# Patient Record
Sex: Female | Born: 1985 | Race: Black or African American | Marital: Single | Smoking: Current every day smoker
Health system: Southern US, Community
[De-identification: ages and names within clinical notes are randomized; demographics above are authoritative.]

---
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
## 2010-09-25 ENCOUNTER — Encounter: Payer: Self-pay | Admitting: Gastroenterology

## 2011-07-27 ENCOUNTER — Emergency Department (HOSPITAL_COMMUNITY)
Admission: EM | Admit: 2011-07-27 | Discharge: 2011-07-28 | Disposition: A | Discharge status: Home / Self Care | Payer: Self-pay | Attending: Emergency Medicine | Admitting: Emergency Medicine

## 2011-07-27 ENCOUNTER — Other Ambulatory Visit: Payer: Self-pay

## 2011-07-27 DIAGNOSIS — R079 Chest pain, unspecified: Secondary | ICD-10-CM | POA: Insufficient documentation

## 2011-07-27 DIAGNOSIS — F17200 Nicotine dependence, unspecified, uncomplicated: Secondary | ICD-10-CM | POA: Insufficient documentation

## 2011-07-27 DIAGNOSIS — J40 Bronchitis, not specified as acute or chronic: Secondary | ICD-10-CM

## 2011-07-27 MED ORDER — HYDROMORPHONE HCL PF 1 MG/ML IJ SOLN
1.0000 mg | Freq: Once | INTRAMUSCULAR | Status: AC
  Administered 2011-07-28: 1 mg via INTRAVENOUS
  Filled 2011-07-27: qty 1

## 2011-07-27 MED ORDER — ONDANSETRON HCL 4 MG/2ML IJ SOLN
4.0000 mg | Freq: Once | INTRAMUSCULAR | Status: AC
  Administered 2011-07-28: 4 mg via INTRAVENOUS
  Filled 2011-07-27: qty 2

## 2011-07-28 ENCOUNTER — Encounter (HOSPITAL_COMMUNITY): Payer: Self-pay | Admitting: *Deleted

## 2011-07-28 ENCOUNTER — Emergency Department (HOSPITAL_COMMUNITY): Payer: Self-pay

## 2011-07-28 LAB — CBC
MCH: 30.9 pg (ref 26.0–34.0)
Platelets: 267 10*3/uL (ref 150–400)
RBC: 5.15 MIL/uL — ABNORMAL HIGH (ref 3.87–5.11)
WBC: 21.8 10*3/uL — ABNORMAL HIGH (ref 4.0–10.5)

## 2011-07-28 LAB — POCT I-STAT TROPONIN I

## 2011-07-28 LAB — D-DIMER, QUANTITATIVE: D-Dimer, Quant: 0.71 ug/mL-FEU — ABNORMAL HIGH (ref 0.00–0.48)

## 2011-07-28 LAB — DIFFERENTIAL
Eosinophils Absolute: 1.2 10*3/uL — ABNORMAL HIGH (ref 0.0–0.7)
Lymphocytes Relative: 26 % (ref 12–46)
Lymphs Abs: 5.8 10*3/uL — ABNORMAL HIGH (ref 0.7–4.0)
Neutro Abs: 13.1 10*3/uL — ABNORMAL HIGH (ref 1.7–7.7)
Neutrophils Relative %: 60 % (ref 43–77)

## 2011-07-28 LAB — BASIC METABOLIC PANEL
GFR calc non Af Amer: 75 mL/min — ABNORMAL LOW (ref >90)
Glucose, Bld: 98 mg/dL (ref 70–99)
Potassium: 3.6 mEq/L (ref 3.5–5.1)
Sodium: 140 mEq/L (ref 135–145)

## 2011-07-28 MED ORDER — HYDROCODONE-ACETAMINOPHEN 5-325 MG PO TABS: 1.0000 | ORAL_TABLET | Freq: Four times a day (QID) | ORAL | Status: AC | PRN

## 2011-07-28 MED ORDER — DOXYCYCLINE HYCLATE 100 MG PO CAPS: 100.0000 mg | ORAL_CAPSULE | Freq: Two times a day (BID) | ORAL | Status: AC

## 2011-07-28 MED ORDER — IOHEXOL 300 MG/ML  SOLN
100.0000 mL | Freq: Once | INTRAMUSCULAR | Status: AC | PRN
  Administered 2011-07-28: 100 mL via INTRAVENOUS

## 2011-07-28 NOTE — ED Provider Notes (Signed)
History     CSN: [id number] Arrival date & time: 07/27/2011 11:47 PM   First MD Initiated Contact with Patient 07/27/11 2349      Chief Complaint  Patient presents with  . Chest Pain    (Consider location/radiation/quality/duration/timing/severity/associated sxs/prior treatment) Patient is a 25 y.o. female presenting with chest pain. The history is provided by the patient (the pt complains of chest pain with inspiration).  Chest Pain The chest pain began 2 days ago. Chest pain occurs frequently. The chest pain is unchanged. The pain is associated with breathing. At its most intense, the pain is at 5/10. The pain is currently at 3/10. The severity of the pain is moderate. The pain does not radiate. Primary symptoms include a fever. Pertinent negatives for primary symptoms include no fatigue, no cough and no abdominal pain. She tried nothing for the symptoms. Risk factors include no known risk factors.  Pertinent negatives for past medical history include no aneurysm and no seizures.     History reviewed. No pertinent past medical history.  No past surgical history on file.  No family history on file.  History  Substance Use Topics  . Smoking status: Not on file  . Smokeless tobacco: Not on file  . Alcohol Use: Not on file    OB History    Grav Para Term Preterm Abortions TAB SAB Ect Mult Living                  Review of Systems  Constitutional: Positive for fever. Negative for fatigue.  HENT: Negative for congestion, sinus pressure and ear discharge.   Eyes: Negative for discharge.  Respiratory: Negative for cough.   Cardiovascular: Positive for chest pain.  Gastrointestinal: Negative for abdominal pain and diarrhea.  Genitourinary: Negative for frequency and hematuria.  Musculoskeletal: Negative for back pain.  Skin: Negative for rash.  Neurological: Negative for seizures and headaches.  Hematological: Negative.   Psychiatric/Behavioral: Negative for  hallucinations.    Allergies  Review of patient's allergies indicates no known allergies.  Home Medications   Current Outpatient Rx  Name Route Sig Dispense Refill  . DOXYCYCLINE HYCLATE 100 MG PO CAPS Oral Take 1 capsule (100 mg total) by mouth 2 (two) times daily. 20 capsule 0  . HYDROCODONE-ACETAMINOPHEN 5-325 MG PO TABS Oral Take 1 tablet by mouth every 6 (six) hours as needed for pain. 20 tablet 0    BP 155/98  Pulse 75  Temp(Src) 99.6 F (37.6 C) (Oral)  Resp 17  Ht 5\' 3"  (1.6 m)  Wt 155 lb (70.308 kg)  BMI 27.46 kg/m2  SpO2 100%  LMP 07/23/2011  Physical Exam  Constitutional: She is oriented to person, place, and time. She appears well-developed.  HENT:  Head: Normocephalic and atraumatic.  Eyes: Conjunctivae and EOM are normal. No scleral icterus.  Neck: Neck supple. No thyromegaly present.  Cardiovascular: Normal rate and regular rhythm.  Exam reveals no gallop and no friction rub.   No murmur heard. Pulmonary/Chest: No stridor. She has no wheezes. She has no rales. She exhibits no tenderness.  Abdominal: She exhibits no distension. There is no tenderness. There is no rebound.  Musculoskeletal: Normal range of motion. She exhibits no edema.  Lymphadenopathy:    She has no cervical adenopathy.  Neurological: She is oriented to person, place, and time. Coordination normal.  Skin: No rash noted. No erythema.  Psychiatric: She has a normal mood and affect. Her behavior is normal.    ED Course  Procedures (  including critical care time)  Labs Reviewed  CBC - Abnormal; Notable for the following:    WBC 21.8 (*)    RBC 5.15 (*)    Hemoglobin 15.9 (*)    MCHC 36.5 (*)    All other components within normal limits  DIFFERENTIAL - Abnormal; Notable for the following:    Neutro Abs 13.1 (*)    Lymphs Abs 5.8 (*)    Monocytes Absolute 1.5 (*)    Eosinophils Relative 6 (*)    Eosinophils Absolute 1.2 (*)    All other components within normal limits  BASIC  METABOLIC PANEL - Abnormal; Notable for the following:    GFR calc non Af Amer 75 (*)    GFR calc Af Amer 87 (*)    All other components within normal limits  D-DIMER, QUANTITATIVE - Abnormal; Notable for the following:    D-Dimer, Quant 0.71 (*)    All other components within normal limits  POCT I-STAT TROPONIN I  I-STAT TROPONIN I   Dg Chest 2 View  07/28/2011  *RADIOLOGY REPORT*  Clinical Data: Chest pain.  CHEST - 2 VIEW 07/28/2011:  Comparison: None.  Findings: Cardiomediastinal silhouette unremarkable.  Lungs clear. Bronchovascular markings normal.  Pulmonary vascularity normal.  No pleural effusions.  No pneumothorax.  Visualized bony thorax intact.  IMPRESSION: Normal examination.  Original Report Authenticated By: Arnell Sieving, M.D.   Ct Angio Chest W/cm &/or Wo Cm  07/28/2011  *RADIOLOGY REPORT*  Clinical Data:  Smoker presenting with chest pain, shortness of breath, and elevated D-dimer.  CT ANGIOGRAPHY CHEST WITH CONTRAST 07/08/2011:,  Technique:  Multidetector CT imaging of the chest was performed using the standard protocol during bolus administration of intravenous contrast.  Multiplanar CT image reconstructions including MIPs were obtained to evaluate the vascular anatomy.  Contrast: OMNIPAQUE IOHEXOL 300 MG/ML IV.  Comparison:  No prior CT.  Two-view chest x-ray earlier same date.  Findings:  Contrast opacification of pulmonary arteries is very good.  No filling defects within either main pulmonary artery or their branches in either lung to suggest pulmonary embolism.  Heart size normal.  No visible coronary artery calcification.  No pericardial effusion.  No visible atherosclerosis involving the thoracic or upper abdominal aorta or their visualized branches.  Central airways patent with marked central peribronchial thickening.  Scattered areas of hyperlucency in both lungs.  No localized airspace consolidation.  No evidence of interstitial lung disease.  No pulmonary  parenchymal nodules or masses.  No pleural effusions.  No significant mediastinal, hilar, or axillary lymphadenopathy. Visualized thyroid gland unremarkable.  Visualized upper abdomen unremarkable for the early arterial phase of enhancement.  Bone window images unremarkable.  Review of the MIP images confirms the above findings.  IMPRESSION:  1.  No evidence of pulmonary embolism. 2.  Marked central bronchial wall thickening and scattered areas of localized air trapping in both lungs is consistent with severe bronchitis and/or asthma.  No acute cardiopulmonary disease otherwise.  Original Report Authenticated By: Arnell Sieving, M.D.     1. Bronchitis   2. Chest pain       MDM   Date: 07/28/2011  Rate: 85  Rhythm: normal sinus rhythm  QRS Axis: normal  Intervals: PR prolonged  ST/T Wave abnormalities: nonspecific ST changes  Conduction Disutrbances:none  Narrative Interpretation:   Old EKG Reviewed: none available          Benny Lennert, MD 07/28/11 [phone]

## 2014-09-19 ENCOUNTER — Ambulatory Visit: Payer: Self-pay | Admitting: Family Medicine

## 2015-08-31 ENCOUNTER — Emergency Department: Payer: Self-pay

## 2015-08-31 ENCOUNTER — Emergency Department
Admission: EM | Admit: 2015-08-31 | Discharge: 2015-08-31 | Disposition: A | Discharge status: Home / Self Care | Payer: Self-pay | Attending: Emergency Medicine | Admitting: Emergency Medicine

## 2015-08-31 ENCOUNTER — Encounter: Payer: Self-pay | Admitting: Emergency Medicine

## 2015-08-31 DIAGNOSIS — Y998 Other external cause status: Secondary | ICD-10-CM | POA: Insufficient documentation

## 2015-08-31 DIAGNOSIS — S63619A Unspecified sprain of unspecified finger, initial encounter: Secondary | ICD-10-CM

## 2015-08-31 DIAGNOSIS — Y9389 Activity, other specified: Secondary | ICD-10-CM | POA: Insufficient documentation

## 2015-08-31 DIAGNOSIS — Y9289 Other specified places as the place of occurrence of the external cause: Secondary | ICD-10-CM | POA: Insufficient documentation

## 2015-08-31 DIAGNOSIS — S63613A Unspecified sprain of left middle finger, initial encounter: Secondary | ICD-10-CM | POA: Insufficient documentation

## 2015-08-31 DIAGNOSIS — F17210 Nicotine dependence, cigarettes, uncomplicated: Secondary | ICD-10-CM | POA: Insufficient documentation

## 2015-08-31 DIAGNOSIS — S63501A Unspecified sprain of right wrist, initial encounter: Secondary | ICD-10-CM | POA: Insufficient documentation

## 2015-08-31 MED ORDER — OXYCODONE-ACETAMINOPHEN 5-325 MG PO TABS
1.0000 | ORAL_TABLET | Freq: Once | ORAL | Status: AC
  Administered 2015-08-31: 1 via ORAL
  Filled 2015-08-31: qty 1

## 2015-08-31 MED ORDER — IBUPROFEN 800 MG PO TABS
800.0000 mg | ORAL_TABLET | Freq: Once | ORAL | Status: AC
  Administered 2015-08-31: 800 mg via ORAL
  Filled 2015-08-31: qty 1

## 2015-08-31 MED ORDER — NAPROXEN 500 MG PO TABS: 500.0000 mg | ORAL_TABLET | Freq: Two times a day (BID) | ORAL | Status: DC
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
## 2015-08-31 NOTE — Discharge Instructions (Signed)
Wear splint for 2-3 days as needed. Finger Sprain A finger sprain happens when the bands of tissue that hold the finger bones together (ligaments) stretch too much and tear. HOME CARE  Keep your injured finger raised (elevated) when possible.  Put ice on the injured area, twice a day, for 2 to 3 days.  Put ice in a plastic bag.  Place a towel between your skin and the bag.  Leave the ice on for 15 minutes.  Only take medicine as told by your doctor.  Do not wear rings on the injured finger.  Protect your finger until pain and stiffness go away (usually 3 to 4 weeks).  Do not get your cast or splint to get wet. Cover your cast or splint with a plastic bag when you shower or bathe. Do not swim.  Your doctor may suggest special exercises for you to do. These exercises will help keep or stop stiffness from happening. GET HELP RIGHT AWAY IF:  Your cast or splint gets damaged.  Your pain gets worse, not better. MAKE SURE YOU:  Understand these instructions.  Will watch your condition.  Will get help right away if you are not doing well or get worse.   This information is not intended to replace advice given to you by your health care provider. Make sure you discuss any questions you have with your health care provider.   Document Released: 09/24/2010 Document Revised: 11/14/2011 Document Reviewed: 04/25/2011 Elsevier Interactive Patient Education [employer]2016 [employer]

## 2016-04-05 ENCOUNTER — Emergency Department
Admission: EM | Admit: 2016-04-05 | Discharge: 2016-04-05 | Disposition: A | Discharge status: Home / Self Care | Payer: Self-pay | Attending: Emergency Medicine | Admitting: Emergency Medicine

## 2016-04-05 ENCOUNTER — Encounter: Payer: Self-pay | Admitting: Emergency Medicine

## 2016-04-05 ENCOUNTER — Emergency Department: Payer: Self-pay

## 2016-04-05 DIAGNOSIS — F17210 Nicotine dependence, cigarettes, uncomplicated: Secondary | ICD-10-CM | POA: Insufficient documentation

## 2016-04-05 DIAGNOSIS — Y999 Unspecified external cause status: Secondary | ICD-10-CM | POA: Insufficient documentation

## 2016-04-05 DIAGNOSIS — S61411A Laceration without foreign body of right hand, initial encounter: Secondary | ICD-10-CM | POA: Insufficient documentation

## 2016-04-05 DIAGNOSIS — S60221A Contusion of right hand, initial encounter: Secondary | ICD-10-CM

## 2016-04-05 DIAGNOSIS — Y929 Unspecified place or not applicable: Secondary | ICD-10-CM | POA: Insufficient documentation

## 2016-04-05 DIAGNOSIS — W25XXXA Contact with sharp glass, initial encounter: Secondary | ICD-10-CM | POA: Insufficient documentation

## 2016-04-05 DIAGNOSIS — Y9389 Activity, other specified: Secondary | ICD-10-CM | POA: Insufficient documentation

## 2016-04-05 NOTE — ED Triage Notes (Addendum)
Patient brought in by [employer] for medical clearance. Patient with laceration to right wrist, bleeding controlled. Patient refuses to answer how she got the laceration to her wrist.
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
## 2016-04-05 NOTE — ED Notes (Signed)
Pt discharged to jail with police, pt cursing and yelling not able to review instructions at this time.
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
## 2016-04-05 NOTE — ED Notes (Signed)
Pt cursing and refusing to listen to instructions at this time, paper sent with police to jail

## 2016-04-05 NOTE — ED Notes (Signed)
Abrasions cleaned with saline and peroxide

## 2016-04-05 NOTE — ED Notes (Signed)
X ray done

## 2017-01-14 ENCOUNTER — Emergency Department
Admission: EM | Admit: 2017-01-14 | Discharge: 2017-01-14 | Disposition: A | Discharge status: Home / Self Care | Payer: BLUE CROSS/BLUE SHIELD | Attending: Emergency Medicine | Admitting: Emergency Medicine

## 2017-01-14 ENCOUNTER — Emergency Department: Payer: BLUE CROSS/BLUE SHIELD

## 2017-01-14 ENCOUNTER — Encounter: Payer: Self-pay | Admitting: Emergency Medicine

## 2017-01-14 DIAGNOSIS — R319 Hematuria, unspecified: Secondary | ICD-10-CM | POA: Diagnosis present

## 2017-01-14 DIAGNOSIS — N390 Urinary tract infection, site not specified: Secondary | ICD-10-CM | POA: Insufficient documentation

## 2017-01-14 DIAGNOSIS — F17210 Nicotine dependence, cigarettes, uncomplicated: Secondary | ICD-10-CM | POA: Diagnosis not present

## 2017-01-14 LAB — CBC
HCT: 42.4 % (ref 35.0–47.0)
Hemoglobin: 15 g/dL (ref 12.0–16.0)
MCH: 29.7 pg (ref 26.0–34.0)
MCHC: 35.4 g/dL (ref 32.0–36.0)
MCV: 84 fL (ref 80.0–100.0)
PLATELETS: 240 10*3/uL (ref 150–440)
RBC: 5.04 MIL/uL (ref 3.80–5.20)
RDW: 13.2 % (ref 11.5–14.5)
WBC: 8.8 10*3/uL (ref 3.6–11.0)

## 2017-01-14 LAB — COMPREHENSIVE METABOLIC PANEL
ALBUMIN: 3.8 g/dL (ref 3.5–5.0)
ALT: 18 U/L (ref 14–54)
AST: 24 U/L (ref 15–41)
Alkaline Phosphatase: 55 U/L (ref 38–126)
Anion gap: 7 (ref 5–15)
BUN: 11 mg/dL (ref 6–20)
CALCIUM: 8.8 mg/dL — AB (ref 8.9–10.3)
CHLORIDE: 108 mmol/L (ref 101–111)
CO2: 25 mmol/L (ref 22–32)
CREATININE: 1.01 mg/dL — AB (ref 0.44–1.00)
GFR calc Af Amer: >60 mL/min (ref >60)
GFR calc non Af Amer: >60 mL/min (ref >60)
GLUCOSE: 146 mg/dL — AB (ref 65–99)
Potassium: 3.7 mmol/L (ref 3.5–5.1)
SODIUM: 140 mmol/L (ref 135–145)
Total Bilirubin: 0.8 mg/dL (ref 0.3–1.2)
Total Protein: 7.1 g/dL (ref 6.5–8.1)

## 2017-01-14 LAB — URINALYSIS, COMPLETE (UACMP) WITH MICROSCOPIC
BACTERIA UA: NONE SEEN
Bilirubin Urine: NEGATIVE
Glucose, UA: NEGATIVE mg/dL
KETONES UR: NEGATIVE mg/dL
Nitrite: NEGATIVE
PROTEIN: 30 mg/dL — AB
Specific Gravity, Urine: 1.024 (ref 1.005–1.030)
pH: 6 (ref 5.0–8.0)

## 2017-01-14 LAB — WET PREP, GENITAL
CLUE CELLS WET PREP: NONE SEEN
SPERM: NONE SEEN
Trich, Wet Prep: NONE SEEN
Yeast Wet Prep HPF POC: NONE SEEN

## 2017-01-14 LAB — POCT PREGNANCY, URINE
PREG TEST UR: NEGATIVE
Preg Test, Ur: NEGATIVE

## 2017-01-14 LAB — LIPASE, BLOOD: LIPASE: 60 U/L — AB (ref 11–51)

## 2017-01-14 LAB — CHLAMYDIA/NGC RT PCR (ARMC ONLY)
CHLAMYDIA TR: NOT DETECTED
N gonorrhoeae: NOT DETECTED

## 2017-01-14 MED ORDER — CEPHALEXIN 500 MG PO CAPS: 500.0000 mg | ORAL_CAPSULE | Freq: Two times a day (BID) | ORAL | 0 refills | Status: DC

## 2017-01-14 NOTE — ED Notes (Signed)
Patient transported to CT 

## 2017-01-14 NOTE — ED Notes (Signed)
Pt returned from CT. Pt remains in NAD at this time.

## 2017-01-14 NOTE — ED Triage Notes (Signed)
Patient presents to the ED with dysuria, hematuria, urinary frequency and right lower pelvic pain.  Patient is in no obvious distress at this time.

## 2017-01-14 NOTE — ED Provider Notes (Signed)
[facility] Emergency Department Provider Note  ____________________________________________  Time seen: Approximately 8:08 PM  I have reviewed the triage vital signs and the nursing notes.   HISTORY  Chief Complaint Abdominal Pain and Hematuria    HPI Isabella Clements is a 31 y.o. female who complains of right groin pain and feeling like she has swollen lymph nodes. She's been the [place] monogamous with one sexual partner for the past 6 years. Recently they had a discussion where he revealed that he has been sleeping with other people as well. She is concerned about sexual transmitted infections . Denies vaginal bleeding or discharge. Does complain of dysuria and urinary frequency. No abdominal pain.     History reviewed. No pertinent past medical history.   There are no active problems to display for this patient.    History reviewed. No pertinent surgical history.   Prior to Admission medications   Medication Sig Start Date End Date Taking? Authorizing Provider  cephALEXin (KEFLEX) 500 MG capsule Take 1 capsule (500 mg total) by mouth 2 (two) times daily. 01/14/17   Sharman CheekStafford, Rebekha Diveley, MD  naproxen (NAPROSYN) 500 MG tablet Take 1 tablet (500 mg total) by mouth 2 (two) times daily with a meal. 08/31/15   Joni ReiningSmith, Ronald K, PA-C     Allergies Patient has no known allergies.   No family history on file.  Social History Social History  Substance Use Topics  . Smoking status: Current Every Day Smoker    Packs/day: 0.50    Types: Cigarettes  . Smokeless tobacco: Never Used  . Alcohol use 4.2 oz/week    7 Cans of beer per week     Comment: pt states, "a beer a day"    Review of Systems  Constitutional:   No fever or chills.  ENT:   No sore throat. No rhinorrhea. Lymphatic: No swollen glands, No extremity swelling Endocrine: No hot/cold flashes. No significant weight change. No neck swelling. Cardiovascular:   No chest pain or  syncope. Respiratory:   No dyspnea or cough. Gastrointestinal:   Negative for abdominal pain, vomiting and diarrhea.  Genitourinary:   Positive dysuria and frequency. Positive hematuria.. Musculoskeletal:   Negative for focal pain or swelling Neurological:   Negative for headaches or weakness. All other systems reviewed and are negative except as documented above in ROS and HPI.  ____________________________________________   PHYSICAL EXAM:  VITAL SIGNS: ED Triage Vitals  Enc Vitals Group     BP 01/14/17 1533 (!) 137/116     Pulse Rate 01/14/17 1533 100     Resp 01/14/17 1533 18     Temp 01/14/17 1533 98.5 F (36.9 C)     Temp Source 01/14/17 1533 Oral     SpO2 01/14/17 1533 100 %     Weight --      Height 01/14/17 1534 5\' 3"  (1.6 m)     Head Circumference --      Peak Flow --      Pain Score 01/14/17 1533 6     Pain Loc --      Pain Edu? --      Excl. in GC? --     Vital signs reviewed, nursing assessments reviewed.   Constitutional:   Alert and oriented. Well appearing and in no distress. Eyes:   No scleral icterus. No conjunctival pallor. PERRL. EOMI.  No nystagmus. ENT   Head:   Normocephalic and atraumatic.   Nose:   No congestion/rhinnorhea. No septal hematoma  Mouth/Throat:   MMM, no pharyngeal erythema. No peritonsillar mass.    Neck:   No stridor. No SubQ emphysema. No meningismus. Hematological/Lymphatic/Immunilogical:   No cervical lymphadenopathy. Cardiovascular:   RRR. Symmetric bilateral radial and DP pulses.  No murmurs.  Respiratory:   Normal respiratory effort without tachypnea nor retractions. Breath sounds are clear and equal bilaterally. No wheezes/rales/rhonchi. Gastrointestinal:   Soft With slight right lower quadrant tenderness, no tenderness at McBurney's point.. Non distended. There is no CVA tenderness.  No rebound, rigidity, or guarding. Genitourinary:   Performed with nurse at bedside.  external exam unremarkable. Subcentimeter  lymph nodes in the right inguinal area. Speculum exam unremarkable. No pathologic discharge. Bimanual exam shows no CMT, no adnexal tenderness or mass. Overall unremarkable exam Musculoskeletal:   Normal range of motion in all extremities. No joint effusions.  No lower extremity tenderness.  No edema. Neurologic:   Normal speech and language.  CN 2-10 normal. Motor grossly intact. No gross focal neurologic deficits are appreciated.  Skin:    Skin is warm, dry and intact. No rash noted.  No petechiae, purpura, or bullae.  ____________________________________________    LABS (pertinent positives/negatives) (all labs ordered are listed, but only abnormal results are displayed) Labs Reviewed  WET PREP, GENITAL - Abnormal; Notable for the following:       Result Value   WBC, Wet Prep HPF POC FEW (*)    All other components within normal limits  LIPASE, BLOOD - Abnormal; Notable for the following:    Lipase 60 (*)    All other components within normal limits  COMPREHENSIVE METABOLIC PANEL - Abnormal; Notable for the following:    Glucose, Bld 146 (*)    Creatinine, Ser 1.01 (*)    Calcium 8.8 (*)    All other components within normal limits  URINALYSIS, COMPLETE (UACMP) WITH MICROSCOPIC - Abnormal; Notable for the following:    Color, Urine YELLOW (*)    APPearance HAZY (*)    Hgb urine dipstick LARGE (*)    Protein, ur 30 (*)    Leukocytes, UA TRACE (*)    Squamous Epithelial / LPF 0-5 (*)    All other components within normal limits  CHLAMYDIA/NGC RT PCR ([facility] ONLY)  URINE CULTURE  CBC  RPR  HIV ANTIBODY (ROUTINE TESTING)  POC URINE PREG, ED  POCT PREGNANCY, URINE  POCT PREGNANCY, URINE   ____________________________________________   EKG    ____________________________________________    RADIOLOGY  Ct Renal Stone Study  Result Date: 01/14/2017 CLINICAL DATA:  Acute onset of right lower pelvic pain and increased urinary frequency. Hematuria and dysuria.  Initial encounter. EXAM: CT ABDOMEN AND PELVIS WITHOUT CONTRAST TECHNIQUE: Multidetector CT imaging of the abdomen and pelvis was performed following the standard protocol without IV contrast. COMPARISON:  None. FINDINGS: Lower chest: The visualized lung bases are grossly clear. The visualized portions of the mediastinum are unremarkable. Hepatobiliary: The liver is unremarkable in appearance. The gallbladder is unremarkable in appearance. The common bile duct remains normal in caliber. Pancreas: The pancreas is within normal limits. Spleen: The spleen is unremarkable in appearance. Adrenals/Urinary Tract: The adrenal glands are unremarkable in appearance. Scattered nonobstructing bilateral renal stones are seen, measuring up to 3 mm in size. Mild nonspecific perinephric stranding is noted bilaterally. There is no evidence of hydronephrosis. No obstructing ureteral stones are identified. Stomach/Bowel: The stomach is unremarkable in appearance. The small bowel is within normal limits. The appendix is normal in caliber, without evidence of appendicitis. The colon  is unremarkable in appearance. Vascular/Lymphatic: The abdominal aorta is unremarkable in appearance. The inferior vena cava is grossly unremarkable. No retroperitoneal lymphadenopathy is seen. No pelvic sidewall lymphadenopathy is identified. Reproductive: The bladder is mildly distended and grossly unremarkable. The uterus is unremarkable in appearance. The ovaries are relatively symmetric. Minimal soft tissue inflammation about the cervix may reflect mild pelvic infection. Other: No additional soft tissue abnormalities are seen. Musculoskeletal: No acute osseous abnormalities are identified. The visualized musculature is unremarkable in appearance. IMPRESSION: 1. Minimal soft tissue inflammation about the cervix may reflect mild pelvic infection. Would correlate with the patient's symptoms. 2. Nonobstructing bilateral renal stones measure up to 3 mm in  size. No evidence of hydronephrosis. No obstructing ureteral stone seen. Electronically Signed   By: Roanna Raider M.D.   On: 01/14/2017 18:53    ____________________________________________   PROCEDURES Procedures  ____________________________________________   INITIAL IMPRESSION / ASSESSMENT AND PLAN / ED COURSE  Pertinent labs & imaging results that were available during my care of the patient were reviewed by me and considered in my medical decision making (see chart for details).    Clinical Course as of Jan 15 2007  Sat Jan 14, 2017  1745 Pelvic exam without overt signs of inflammation/infection. Will get ct a/p for further eval of sx.   [PS]    Clinical Course User Index [PS] Sharman Cheek, MD     ----------------------------------------- 8:14 PM on 01/14/2017 -----------------------------------------  Workup consistent with cystitis. No evidence of pyelonephritis or sepsis. Abdomen otherwise benign with unremarkable workup. Not consistent with PID TOA torsion. GC chlamydia Trichomonas all negative.Considering the patient's symptoms, medical history, and physical examination today, I have low suspicion for cholecystitis or biliary pathology, pancreatitis, perforation or bowel obstruction, hernia, intra-abdominal abscess, AAA or dissection, volvulus or intussusception, mesenteric ischemia, or appendicitis.  Follow-up primary care, Keflex twice a day for UTI. Urine culture sent.  ____________________________________________   FINAL CLINICAL IMPRESSION(S) / ED DIAGNOSES  Final diagnoses:  Lower urinary tract infectious disease  Hematuria, unspecified type      New Prescriptions   CEPHALEXIN (KEFLEX) 500 MG CAPSULE    Take 1 capsule (500 mg total) by mouth 2 (two) times daily.     Portions of this note were generated with dragon dictation software. Dictation errors may occur despite best attempts at proofreading.    Sharman Cheek, MD 01/14/17  2015

## 2017-01-16 LAB — URINE CULTURE: Culture: 50000 — AB

## 2017-01-16 LAB — HIV ANTIBODY (ROUTINE TESTING W REFLEX): HIV Screen 4th Generation wRfx: NONREACTIVE

## 2017-01-16 LAB — RPR: RPR: NONREACTIVE

## 2017-11-10 ENCOUNTER — Emergency Department: Payer: BLUE CROSS/BLUE SHIELD

## 2017-11-10 ENCOUNTER — Encounter: Payer: Self-pay | Admitting: Emergency Medicine

## 2017-11-10 ENCOUNTER — Emergency Department
Admission: EM | Admit: 2017-11-10 | Discharge: 2017-11-11 | Disposition: A | Discharge status: Home / Self Care | Payer: BLUE CROSS/BLUE SHIELD | Attending: Emergency Medicine | Admitting: Emergency Medicine

## 2017-11-10 ENCOUNTER — Other Ambulatory Visit: Payer: Self-pay

## 2017-11-10 DIAGNOSIS — R202 Paresthesia of skin: Secondary | ICD-10-CM | POA: Diagnosis not present

## 2017-11-10 DIAGNOSIS — R200 Anesthesia of skin: Secondary | ICD-10-CM | POA: Diagnosis present

## 2017-11-10 DIAGNOSIS — F17210 Nicotine dependence, cigarettes, uncomplicated: Secondary | ICD-10-CM | POA: Diagnosis not present

## 2017-11-10 LAB — CBC
HEMATOCRIT: 41.7 % (ref 35.0–47.0)
HEMOGLOBIN: 14.5 g/dL (ref 12.0–16.0)
MCH: 29.6 pg (ref 26.0–34.0)
MCHC: 34.7 g/dL (ref 32.0–36.0)
MCV: 85.3 fL (ref 80.0–100.0)
Platelets: 296 10*3/uL (ref 150–440)
RBC: 4.89 MIL/uL (ref 3.80–5.20)
RDW: 13.1 % (ref 11.5–14.5)
WBC: 13.8 10*3/uL — ABNORMAL HIGH (ref 3.6–11.0)

## 2017-11-10 LAB — BASIC METABOLIC PANEL
ANION GAP: 8 (ref 5–15)
BUN: 18 mg/dL (ref 6–20)
CHLORIDE: 108 mmol/L (ref 101–111)
CO2: 22 mmol/L (ref 22–32)
Calcium: 8.8 mg/dL — ABNORMAL LOW (ref 8.9–10.3)
Creatinine, Ser: 0.93 mg/dL (ref 0.44–1.00)
GFR calc Af Amer: >60 mL/min (ref >60)
GFR calc non Af Amer: >60 mL/min (ref >60)
GLUCOSE: 134 mg/dL — AB (ref 65–99)
POTASSIUM: 3.8 mmol/L (ref 3.5–5.1)
Sodium: 138 mmol/L (ref 135–145)

## 2017-11-10 LAB — TROPONIN I: Troponin I: <0.03 ng/mL (ref <0.03)

## 2017-11-10 LAB — LIPASE, BLOOD: Lipase: 42 U/L (ref 11–51)

## 2017-11-10 NOTE — ED Triage Notes (Signed)
Pt reports about a week ago she woke up and felt numbness in her right foot and toes; also having tingling and numbness in the right side of her abd/flank; yesterday leg started feeling week and knee would buckle when walking; now tingling across her belly and having intermittent chest pain; currently no chest pain; pain is "strong"; talking in complete coherent sentences; pt in no acute distress

## 2017-11-11 LAB — URINALYSIS, COMPLETE (UACMP) WITH MICROSCOPIC
Bacteria, UA: NONE SEEN
Bilirubin Urine: NEGATIVE
Glucose, UA: NEGATIVE mg/dL
Hgb urine dipstick: NEGATIVE
Ketones, ur: 5 mg/dL — AB
Leukocytes, UA: NEGATIVE
Nitrite: NEGATIVE
PROTEIN: NEGATIVE mg/dL
Specific Gravity, Urine: 1.023 (ref 1.005–1.030)
pH: 6 (ref 5.0–8.0)

## 2017-11-11 LAB — CK: CK TOTAL: 222 U/L (ref 38–234)

## 2017-11-11 MED ORDER — DIAZEPAM 2 MG PO TABS
2.0000 mg | ORAL_TABLET | Freq: Once | ORAL | Status: AC
  Administered 2017-11-11: 2 mg via ORAL
  Filled 2017-11-11: qty 1

## 2017-11-11 NOTE — ED Notes (Signed)
Pt states that for the past few days she has felt a numbness and tingling in her legs.  She also reports that it feels like someone is squeezing her right knee.  Pt denies pain to legs.  Pt denies hx of diabetes or any other issues.  Pt is not tender on knee to touch.  Pt denies any other symptoms at this time.  Pt states that she cannot urinate at this time and is requesting water.  EDP okay with this.  Pt is A&Ox4, in NAD.

## 2017-11-11 NOTE — ED Provider Notes (Signed)
[facility] Emergency Department Provider Note   ____________________________________________   First MD Initiated Contact with Patient 11/11/17 0015     (approximate)  I have reviewed the triage vital signs and the nursing notes.   HISTORY  Chief Complaint Numbness; Abdominal Pain; and Chest Pain    HPI Isabella Clements is a 32 y.o. female who presents to the ED from home with a chief complaint of right leg numbness and tingling.  Patient reports that one week ago she woke up and felt numbness in her right foot and toes.  Over the course of the week she is now having numbness and tingling to her entire right leg radiating to her right flank.  Symptoms are worse from mid leg to mid thigh circumferentially.  Patient denies extremity weakness but feels like someone is squeezing her right knee and it buckles under her especially at the end of the workday.  Sometimes feels like she is experiencing muscle cramps.  Also describes her right leg as "heavy".  Patient is a CNA.  Denies back pain, bowel or bladder incontinence.  Denies chest pain as noted in triage notes.  Denies recent fever, chills, vision changes, headache, shortness of breath, nausea, vomiting, diarrhea.  Denies recent travel or trauma.  Denies hormone use.   Past medical history None  There are no active problems to display for this patient.   History reviewed. No pertinent surgical history.  Prior to Admission medications   Not on File    Allergies Patient has no known allergies.  History reviewed. No pertinent family history.  Social History Social History   Tobacco Use  . Smoking status: Current Every Day Smoker    Packs/day: 0.50    Types: Cigarettes  . Smokeless tobacco: Never Used  Substance Use Topics  . Alcohol use: Yes    Alcohol/week: 4.2 oz    Types: 7 Cans of beer per week    Comment: pt states, "a beer a day"  . Drug use: No    Review of  Systems  Constitutional: No fever/chills. Eyes: No visual changes. ENT: No sore throat. Cardiovascular: Denies chest pain. Respiratory: Denies shortness of breath. Gastrointestinal: No abdominal pain.  No nausea, no vomiting.  No diarrhea.  No constipation. Genitourinary: Negative for dysuria. Musculoskeletal: Negative for back pain. Skin: Negative for rash. Neurological: Positive for right leg paresthesias.  Negative for headaches, focal weakness.   ____________________________________________   PHYSICAL EXAM:  VITAL SIGNS: ED Triage Vitals  Enc Vitals Group     BP 11/10/17 2009 (!) 158/103     Pulse Rate 11/10/17 2009 83     Resp 11/10/17 2009 17     Temp 11/10/17 2009 99 F (37.2 C)     Temp Source 11/10/17 2009 Oral     SpO2 11/10/17 2009 100 %     Weight 11/10/17 2013 148 lb (67.1 kg)     Height 11/10/17 2013 5\' 3"  (1.6 m)     Head Circumference --      Peak Flow --      Pain Score 11/10/17 2012 7     Pain Loc --      Pain Edu? --      Excl. in GC? --     Constitutional: Alert and oriented. Well appearing and in no acute distress. Eyes: Conjunctivae are normal. PERRL. EOMI. Head: Atraumatic. Nose: No congestion/rhinnorhea. Mouth/Throat: Mucous membranes are moist.  Oropharynx non-erythematous. Neck: No stridor.  No cervical spine tenderness  to palpation.  Supple neck without meningismus.  No carotid bruits. Cardiovascular: Normal rate, regular rhythm. Grossly normal heart sounds.  Good peripheral circulation. Respiratory: Normal respiratory effort.  No retractions. Lungs CTAB. Gastrointestinal: Soft and nontender. No distention. No abdominal bruits. No CVA tenderness. Musculoskeletal: No spinal tenderness to palpation.  No lower extremity tenderness nor edema.  No joint effusions. Neurologic: Alert and oriented x3.  CN II-XII grossly intact.  Normal speech and language.  5/5 motor strength in all extremities.  Symmetrical sensation in bilateral upper extremities.   Hypersensitive on the right lower extremity.  Negative straight leg raise.  No gait instability. Skin:  Skin is warm, dry and intact. No rash noted.  No vesicles.  No petechiae. Psychiatric: Mood and affect are normal. Speech and behavior are normal.  ____________________________________________   LABS (all labs ordered are listed, but only abnormal results are displayed)  Labs Reviewed  BASIC METABOLIC PANEL - Abnormal; Notable for the following components:      Result Value   Glucose, Bld 134 (*)    Calcium 8.8 (*)    All other components within normal limits  CBC - Abnormal; Notable for the following components:   WBC 13.8 (*)    All other components within normal limits  URINALYSIS, COMPLETE (UACMP) WITH MICROSCOPIC - Abnormal; Notable for the following components:   Color, Urine YELLOW (*)    APPearance HAZY (*)    Ketones, ur 5 (*)    Squamous Epithelial / LPF 6-30 (*)    All other components within normal limits  TROPONIN I  LIPASE, BLOOD  CK  POC URINE PREG, ED   ____________________________________________  EKG  ED ECG REPORT I, SUNG,JADE J, the attending physician, personally viewed and interpreted this ECG.   Date: 11/11/2017  EKG Time: 2017  Rate: 61  Rhythm: normal EKG, normal sinus rhythm  Axis: Normal  Intervals:none  ST&T Change: Nonspecific  ____________________________________________  RADIOLOGY  ED MD interpretation: No acute cardiopulmonary process  Official radiology report(s): Dg Chest 2 View  Result Date: 11/10/2017 CLINICAL DATA:  Chest pain EXAM: CHEST - 2 VIEW COMPARISON:  CT 07/28/2011, radiograph 07/28/2011 FINDINGS: The heart size and mediastinal contours are within normal limits. Both lungs are clear. The visualized skeletal structures are unremarkable. IMPRESSION: No active cardiopulmonary disease. Electronically Signed   By: Jasmine Pang M.D.   On: 11/10/2017 20:46     ____________________________________________   PROCEDURES  Procedure(s) performed: None  Procedures  Critical Care performed: No  ____________________________________________   INITIAL IMPRESSION / ASSESSMENT AND PLAN / ED COURSE  As part of my medical decision making, I reviewed the following data within the electronic MEDICAL RECORD NUMBER Nursing notes reviewed and incorporated, Labs reviewed, Old chart reviewed and Notes from prior ED visits   32 year old female who presents with a one-week history of right leg paresthesia and "heavy feeling surrounding her knee.  Differential diagnosis includes but is not limited to TIA, CVA, MS, [place], infectious, metabolic etiologies, etc.  Laboratory and urinalysis results are unremarkable. Unclear etiology of patient's symptoms. I did recommend proceeding with MRI for further evaluation. Patient declines, states she will follow up with her PCP early next week. I encouraged her to speak with her doctor for neurology referral.  Will administer Valium for muscle spasms.  Strict return precautions given.  Patient verbalizes understanding and agrees with plan of care.      ____________________________________________   FINAL CLINICAL IMPRESSION(S) / ED DIAGNOSES  Final diagnoses:  Paresthesia  ED Discharge Orders    None       Note:  This document was prepared using Dragon voice recognition software and may include unintentional dictation errors.    Irean Hong, MD 11/11/17 [id number]

## 2017-11-11 NOTE — Discharge Instructions (Signed)
Please see your doctor as soon as possible.  He will likely need a referral to see a neurologist for your symptoms.  You were offered an MRI tonight but declined.  Please return to the ED if you change your mind or symptoms worsen, you experience leg weakness, losing control of your bowel or bladder, or other concerns.

## 2019-05-22 IMAGING — CR DG CHEST 2V
2 series · 2 of 2 positions shown · non-contrast
Comparison: CT 07/28/2011, radiograph 07/28/2011

CLINICAL DATA: Chest pain

EXAM:
CHEST - 2 VIEW

[chest pa]
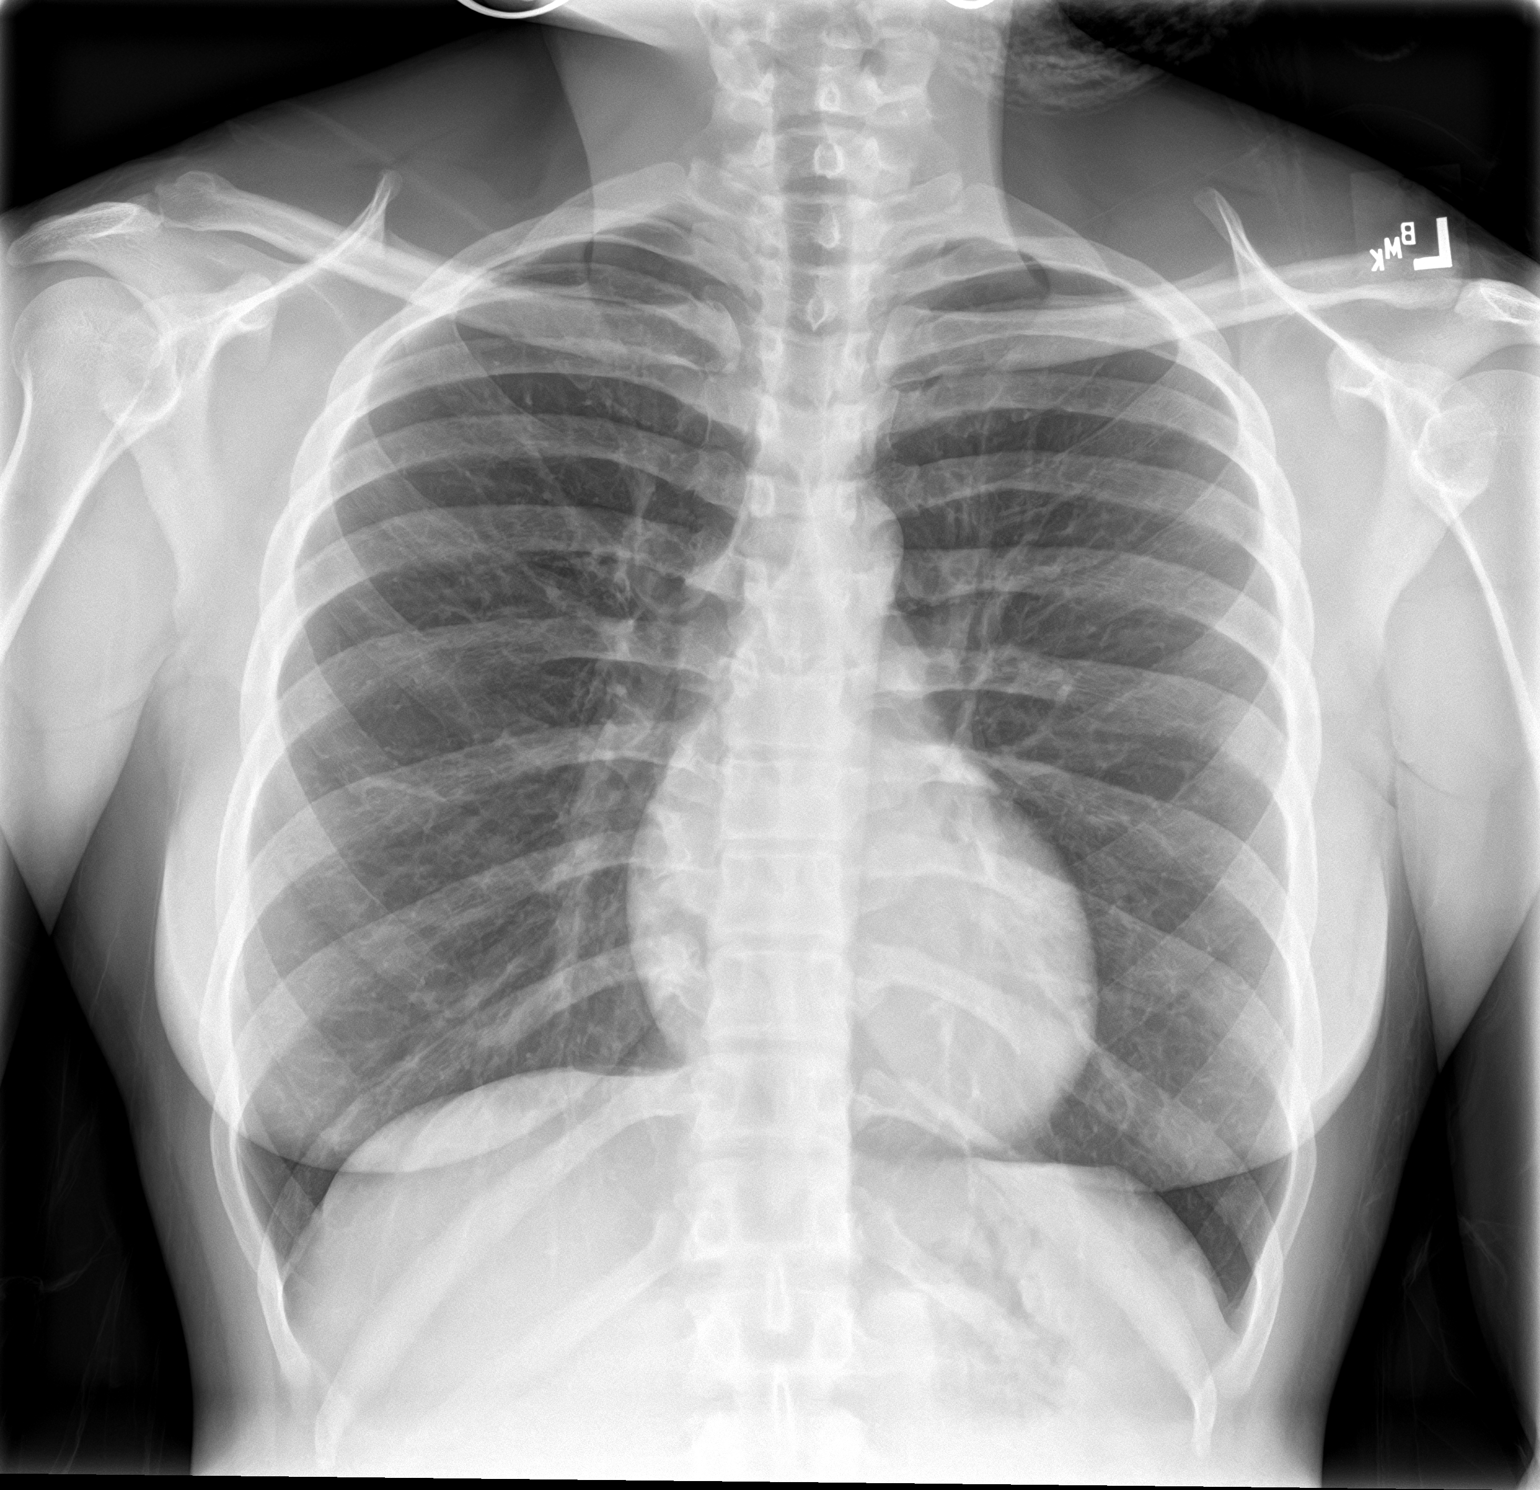

[chest lat]
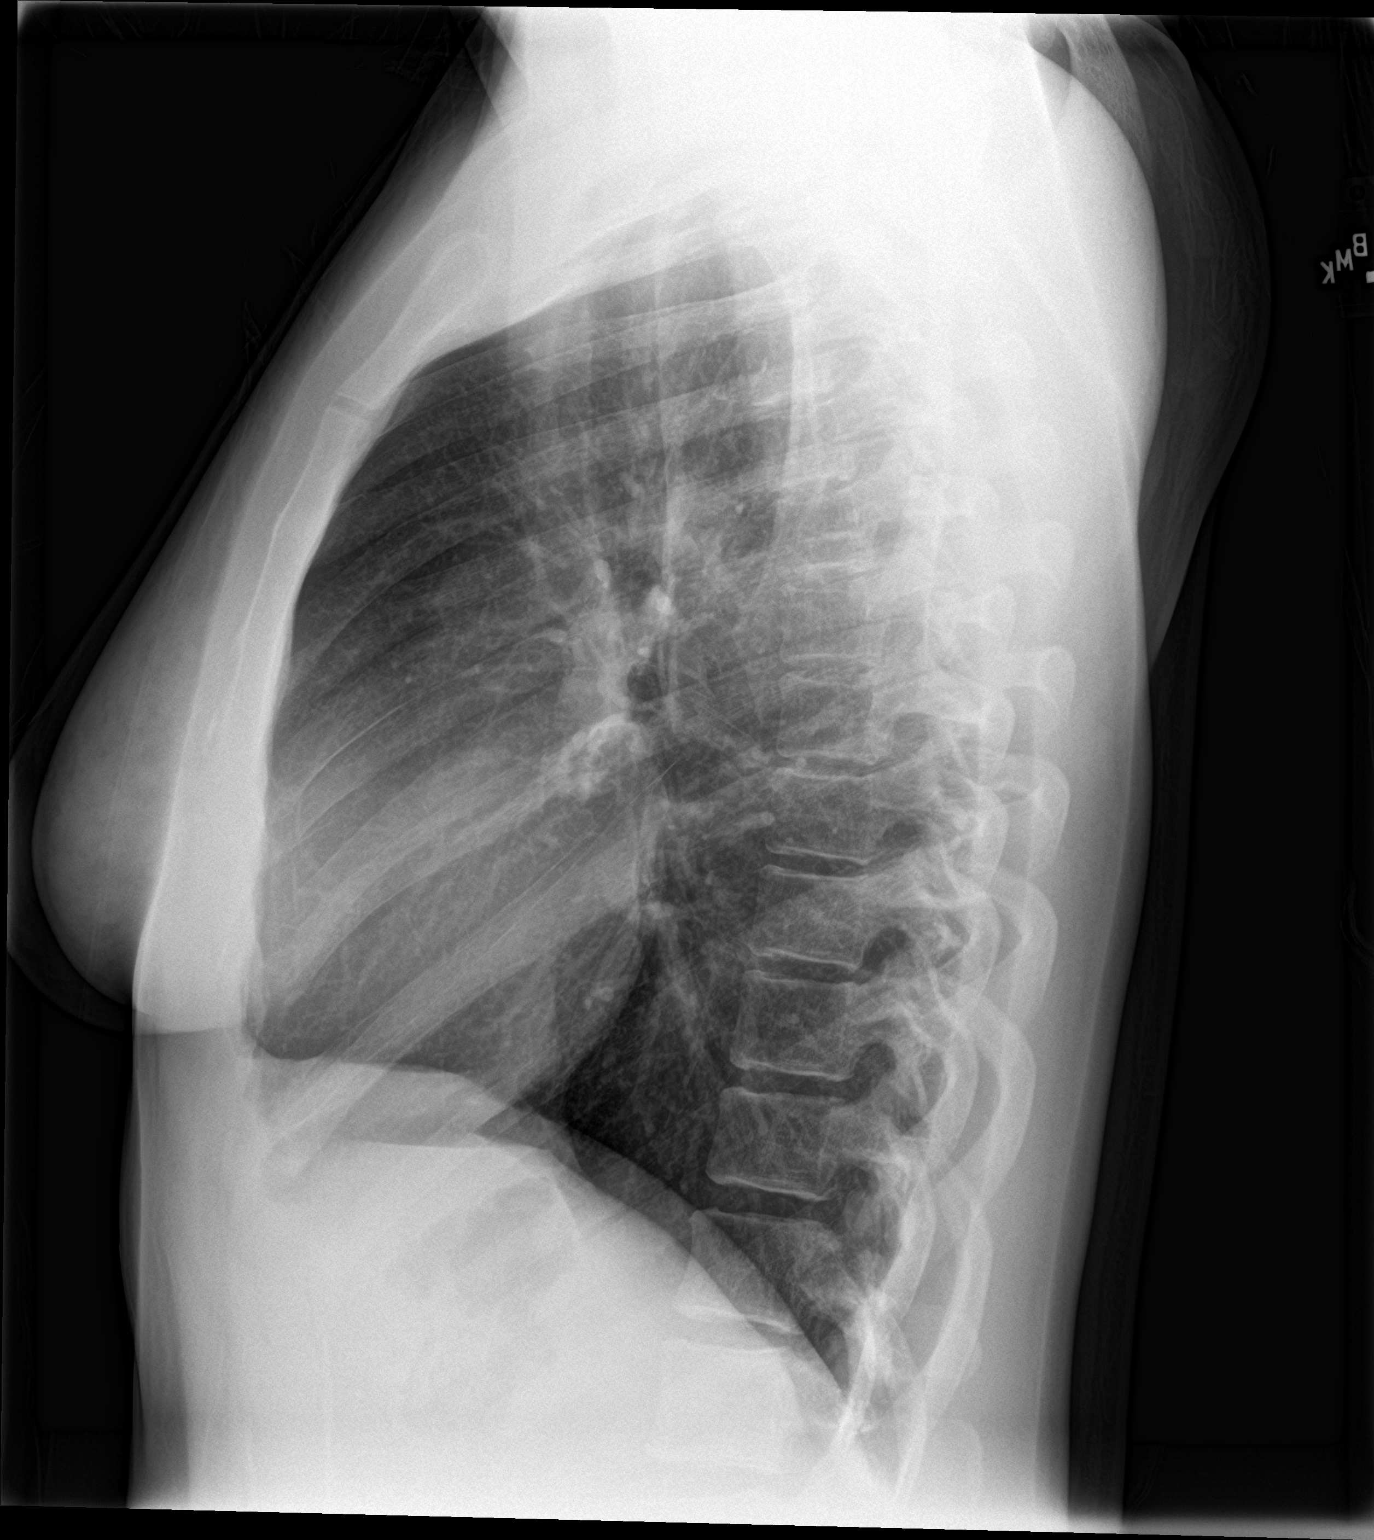

[2 of 2 positions shown; findings below may reference images not displayed]

FINDINGS: The heart size and mediastinal contours are within normal limits.
Both lungs are clear. The visualized skeletal structures are
unremarkable.
IMPRESSION: No active cardiopulmonary disease.

## 2021-04-04 ENCOUNTER — Other Ambulatory Visit: Payer: Self-pay

## 2021-04-04 ENCOUNTER — Inpatient Hospital Stay
Admission: RE | Admit: 2021-04-04 | Discharge: 2021-04-08 | DRG: 885 | Disposition: A | Discharge status: Home / Self Care | Payer: 59 | Source: Intra-hospital | Attending: Psychiatry | Admitting: Psychiatry

## 2021-04-04 ENCOUNTER — Encounter: Payer: Self-pay | Admitting: Psychiatry

## 2021-04-04 ENCOUNTER — Emergency Department
Admission: EM | Admit: 2021-04-04 | Discharge: 2021-04-04 | Disposition: A | Discharge status: Home / Self Care | Payer: Self-pay | Attending: Emergency Medicine | Admitting: Emergency Medicine

## 2021-04-04 ENCOUNTER — Encounter: Payer: Self-pay | Admitting: Emergency Medicine

## 2021-04-04 DIAGNOSIS — F10239 Alcohol dependence with withdrawal, unspecified: Secondary | ICD-10-CM | POA: Diagnosis not present

## 2021-04-04 DIAGNOSIS — F1410 Cocaine abuse, uncomplicated: Secondary | ICD-10-CM | POA: Diagnosis present

## 2021-04-04 DIAGNOSIS — Z20822 Contact with and (suspected) exposure to covid-19: Secondary | ICD-10-CM | POA: Insufficient documentation

## 2021-04-04 DIAGNOSIS — F17210 Nicotine dependence, cigarettes, uncomplicated: Secondary | ICD-10-CM | POA: Insufficient documentation

## 2021-04-04 DIAGNOSIS — F322 Major depressive disorder, single episode, severe without psychotic features: Secondary | ICD-10-CM | POA: Diagnosis present

## 2021-04-04 DIAGNOSIS — F1020 Alcohol dependence, uncomplicated: Secondary | ICD-10-CM | POA: Diagnosis present

## 2021-04-04 DIAGNOSIS — R45851 Suicidal ideations: Secondary | ICD-10-CM | POA: Diagnosis present

## 2021-04-04 DIAGNOSIS — F1490 Cocaine use, unspecified, uncomplicated: Secondary | ICD-10-CM | POA: Insufficient documentation

## 2021-04-04 DIAGNOSIS — Z79899 Other long term (current) drug therapy: Secondary | ICD-10-CM | POA: Diagnosis not present

## 2021-04-04 DIAGNOSIS — F419 Anxiety disorder, unspecified: Secondary | ICD-10-CM | POA: Diagnosis present

## 2021-04-04 DIAGNOSIS — F1290 Cannabis use, unspecified, uncomplicated: Secondary | ICD-10-CM | POA: Insufficient documentation

## 2021-04-04 DIAGNOSIS — Y902 Blood alcohol level of 40-59 mg/100 ml: Secondary | ICD-10-CM | POA: Insufficient documentation

## 2021-04-04 DIAGNOSIS — I10 Essential (primary) hypertension: Secondary | ICD-10-CM | POA: Diagnosis present

## 2021-04-04 LAB — RESP PANEL BY RT-PCR (FLU A&B, COVID) ARPGX2
Influenza A by PCR: NEGATIVE
Influenza B by PCR: NEGATIVE
SARS Coronavirus 2 by RT PCR: NEGATIVE

## 2021-04-04 LAB — CBC
Hemoglobin: 16.3 g/dL — ABNORMAL HIGH (ref 12.0–15.0)
Platelets: 361 10*3/uL (ref 150–400)
WBC: 17.3 10*3/uL — ABNORMAL HIGH (ref 4.0–10.5)
nRBC: 0 % (ref 0.0–0.2)

## 2021-04-04 LAB — COMPREHENSIVE METABOLIC PANEL
ALT: 17 U/L (ref 0–44)
AST: 19 U/L (ref 15–41)
Albumin: 4.6 g/dL (ref 3.5–5.0)
Alkaline Phosphatase: 65 U/L (ref 38–126)
Anion gap: 7 (ref 5–15)
BUN: 12 mg/dL (ref 6–20)
CO2: 23 mmol/L (ref 22–32)
Calcium: 9.3 mg/dL (ref 8.9–10.3)
Chloride: 107 mmol/L (ref 98–111)
Creatinine, Ser: 1.08 mg/dL — ABNORMAL HIGH (ref 0.44–1.00)
GFR, Estimated: >60 mL/min (ref >60)
Glucose, Bld: 89 mg/dL (ref 70–99)
Potassium: 3.7 mmol/L (ref 3.5–5.1)
Sodium: 137 mmol/L (ref 135–145)
Total Bilirubin: 0.7 mg/dL (ref 0.3–1.2)
Total Protein: 7.9 g/dL (ref 6.5–8.1)

## 2021-04-04 LAB — ACETAMINOPHEN LEVEL: Acetaminophen (Tylenol), Serum: <10 ug/mL — ABNORMAL LOW (ref 10–30)

## 2021-04-04 LAB — URINE DRUG SCREEN, QUALITATIVE (ARMC ONLY)
Amphetamines, Ur Screen: NOT DETECTED
Barbiturates, Ur Screen: NOT DETECTED
Benzodiazepine, Ur Scrn: NOT DETECTED
Cannabinoid 50 Ng, Ur ~~LOC~~: POSITIVE — AB
Cocaine Metabolite,Ur ~~LOC~~: POSITIVE — AB
MDMA (Ecstasy)Ur Screen: NOT DETECTED
Methadone Scn, Ur: NOT DETECTED
Opiate, Ur Screen: NOT DETECTED
Phencyclidine (PCP) Ur S: NOT DETECTED
Tricyclic, Ur Screen: NOT DETECTED

## 2021-04-04 LAB — ETHANOL: Alcohol, Ethyl (B): 53 mg/dL — ABNORMAL HIGH (ref <10)

## 2021-04-04 LAB — SALICYLATE LEVEL: Salicylate Lvl: <7 mg/dL — ABNORMAL LOW (ref 7.0–30.0)

## 2021-04-04 MED ORDER — ALUM & MAG HYDROXIDE-SIMETH 200-200-20 MG/5ML PO SUSP: 30.0000 mL | ORAL | Status: DC | PRN

## 2021-04-04 MED ORDER — MAGNESIUM HYDROXIDE 400 MG/5ML PO SUSP: 30.0000 mL | Freq: Every day | ORAL | Status: DC | PRN

## 2021-04-04 MED ORDER — ACETAMINOPHEN 325 MG PO TABS
650.0000 mg | ORAL_TABLET | Freq: Four times a day (QID) | ORAL | Status: DC | PRN
  Administered 2021-04-05 – 2021-04-07 (×5): 650 mg via ORAL
  Filled 2021-04-04 (×5): qty 2

## 2021-04-04 NOTE — ED Triage Notes (Signed)
Pt to ED via [facility] under IVC for SI. Per IVC paperwork pt called 911 stating that she was going to kill herself. When PD arrived pt had a knife and stated that she wanted to kill herself because no one cared about her. Pt is cooperative triage but does appear sad.

## 2021-04-04 NOTE — Plan of Care (Signed)
  Problem: Activity: Goal: Interest or engagement in leisure activities will improve Outcome: Progressing   Problem: Coping: Goal: Coping ability will improve Outcome: Progressing Goal: Will verbalize feelings Outcome: Progressing   Problem: Health Behavior/Discharge Planning: Goal: Identification of resources available to assist in meeting health care needs will improve Outcome: Progressing   Problem: Medication: Goal: Compliance with prescribed medication regimen will improve Outcome: Progressing   Problem: Medication: Goal: Compliance with prescribed medication regimen will improve Outcome: Progressing   Problem: Medication: Goal: Compliance with prescribed medication regimen will improve Outcome: Progressing   Problem: Medication: Goal: Compliance with prescribed medication regimen will improve Outcome: Progressing   Problem: Coping: Goal: Ability to verbalize frustrations and anger appropriately will improve Outcome: Progressing Goal: Ability to demonstrate self-control will improve Outcome: Progressing

## 2021-04-04 NOTE — ED Provider Notes (Signed)
[facility] Emergency Department Provider Note   ____________________________________________   Event Date/Time   First MD Initiated Contact with Patient 04/04/21 1054     (approximate)  I have reviewed the triage vital signs and the nursing notes.   HISTORY  Chief Complaint Suicidal    HPI Isabella Clements is a 35 y.o. female with no significant past medical history presents to the ED complaining of suicidal ideation.  Patient reports that she has been feeling increasingly depressed lately because she does not have a job and feels like no one cares about her.  Patient reportedly called 911 and made statement that she was going to kill herself.  When PD arrived at patient's home, she had a knife and made multiple suicidal statements.  She was placed under IVC and brought to the ED for further evaluation, and is currently calm and cooperative.  She denies any medical complaints at this time.  She admits to regular alcohol consumption, but denies any drug use.        No past medical history on file.  There are no problems to display for this patient.   No past surgical history on file.  Prior to Admission medications   Not on File    Allergies Patient has no known allergies.  No family history on file.  Social History Social History   Tobacco Use   Smoking status: Every Day    Packs/day: 0.50    Types: Cigarettes   Smokeless tobacco: Never  Substance Use Topics   Alcohol use: Yes    Alcohol/week: 7.0 standard drinks    Types: 7 Cans of beer per week    Comment: pt states, "a beer a day"   Drug use: No    Review of Systems  Constitutional: No fever/chills Eyes: No visual changes. ENT: No sore throat. Cardiovascular: Denies chest pain. Respiratory: Denies shortness of breath. Gastrointestinal: No abdominal pain.  No nausea, no vomiting.  No diarrhea.  No constipation. Genitourinary: Negative for dysuria. Musculoskeletal:  Negative for back pain. Skin: Negative for rash. Neurological: Negative for headaches, focal weakness or numbness.  Positive for suicidal ideation.  ____________________________________________   PHYSICAL EXAM:  VITAL SIGNS: ED Triage Vitals  Enc Vitals Group     BP 04/04/21 1045 (!) 163/111     Pulse Rate 04/04/21 1045 82     Resp 04/04/21 1045 16     Temp 04/04/21 1045 98.2 F (36.8 C)     Temp Source 04/04/21 1045 Oral     SpO2 04/04/21 1045 98 %     Weight 04/04/21 1044 140 lb (63.5 kg)     Height 04/04/21 1044 5\' 3"  (1.6 m)     Head Circumference --      Peak Flow --      Pain Score 04/04/21 1044 0     Pain Loc --      Pain Edu? --      Excl. in GC? --     Constitutional: Alert and oriented. Eyes: Conjunctivae are normal. Head: Atraumatic. Nose: No congestion/rhinnorhea. Mouth/Throat: Mucous membranes are moist. Neck: Normal ROM Cardiovascular: Normal rate, regular rhythm. Grossly normal heart sounds. Respiratory: Normal respiratory effort.  No retractions. Lungs CTAB. Gastrointestinal: Soft and nontender. No distention. Genitourinary: deferred Musculoskeletal: No lower extremity tenderness nor edema. Neurologic:  Normal speech and language. No gross focal neurologic deficits are appreciated. Skin:  Skin is warm, dry and intact. No rash noted. Psychiatric: Depressed mood, flat affect.  Speech and behavior are normal.  ____________________________________________   LABS (all labs ordered are listed, but only abnormal results are displayed)  Labs Reviewed  COMPREHENSIVE METABOLIC PANEL - Abnormal; Notable for the following components:      Result Value   Creatinine, Ser 1.08 (*)    All other components within normal limits  ETHANOL - Abnormal; Notable for the following components:   Alcohol, Ethyl (B) 53 (*)    All other components within normal limits  RESP PANEL BY RT-PCR (FLU A&B, COVID) ARPGX2  SALICYLATE LEVEL  ACETAMINOPHEN LEVEL  CBC  URINE DRUG  SCREEN, QUALITATIVE ([facility] ONLY)  POC URINE PREG, ED    PROCEDURES  Procedure(s) performed (including Critical Care):  Procedures   ____________________________________________   INITIAL IMPRESSION / ASSESSMENT AND PLAN / ED COURSE      35 year old female with no significant past medical history presents to the ED complaining of suicidal ideation, was found by police holding a knife and making threats of suicide.  She arrives under IVC, is now calm and cooperative.  She denies any medical complaints, screening labs are pending but if these are unremarkable she may be medically cleared for psychiatric evaluation.  Screening labs are unremarkable, patient may be medically cleared for psychiatric disposition.  The patient has been placed in psychiatric observation due to the need to provide a safe environment for the patient while obtaining psychiatric consultation and evaluation, as well as ongoing medical and medication management to treat the patient's condition.  The patient has been placed under full IVC at this time.       ____________________________________________   FINAL CLINICAL IMPRESSION(S) / ED DIAGNOSES  Final diagnoses:  Suicidal ideation     ED Discharge Orders     None        Note:  This document was prepared using Dragon voice recognition software and may include unintentional dictation errors.    Chesley Noon, MD 04/04/21 [phone]

## 2021-04-04 NOTE — Tx Team (Signed)
Initial Treatment Plan 04/04/2021 6:47 PM [name] SHF:[id number]    PATIENT STRESSORS: Financial difficulties Marital or family conflict Substance abuse   PATIENT STRENGTHS: Ability for insight Capable of independent living Supportive family/friends   PATIENT IDENTIFIED PROBLEMS: Depression  Substance Abuse  Suicidal Ideation                 DISCHARGE CRITERIA:  Improved stabilization in mood, thinking, and/or behavior Reduction of life-threatening or endangering symptoms to within safe limits Verbal commitment to aftercare and medication compliance  PRELIMINARY DISCHARGE PLAN: Attend aftercare/continuing care group Outpatient therapy Return to previous living arrangement Return to previous work or school arrangements  PATIENT/FAMILY INVOLVEMENT: This treatment plan has been presented to and reviewed with the patient, Isabella Clements, and/or family member.  The patient and family have been given the opportunity to ask questions and make suggestions.  Tania Ade, RN 04/04/2021, 6:47 PM
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
## 2021-04-04 NOTE — Consult Note (Signed)
[facility] Face-to-Face Psychiatry Consult   Reason for Consult:  suicide threat Referring Physician:  EDP Patient Identification: Isabella Clements MRN:  [id number] Principal Diagnosis: Major depressive disorder, single episode, severe without psychotic features (HCC) Diagnosis:  Principal Problem:   Major depressive disorder, single episode, severe without psychotic features (HCC) Active Problems:   Alcohol use disorder, moderate, dependence (HCC)   Cocaine abuse (HCC)   Total Time spent with patient: 1 hour  Subjective:   Isabella Clements is a 35 y.o. female patient admitted with "I just don't want to be here anymore", referring to this world.  HPI:  Isabella Clements is a 34-year--old-female brought to the emergency department by police with suicidal ideation. Patient states that "I just didn't want to be here anymore and picked up a knife" to attempt to kill self demonstrating a plan and intent. Patient did not cut or hurt self but continues to state "I just don't want to live anymore." High level of depression with anhedonia, low motivation, lack of appetite and sleep.  No past history of depression or mental health issues or treatment. Patient denies homicidal ideation, psychosis, delusions or hallucinations. Patient is teary-eyed on presentation with a flat affect and very soft speech. Patient states that she has "really not been sleeping" and has lost more than ten pounds in the last month. When asked about alcohol use, patient states that she drinks "every day" but does not want to disclose how much BAL of 53; denies any drug use save occasional marijuana, but toxicology screen is positive for cocaine. Patient denies taking any medications, denies a history of a similar presentation, and denies family history of the same. When asked about safe housing, patient states that she lives with her mother, but is otherwise isolated: is not in a relationship and does not have any friends. Patient states that her  mother and friends "don't really care about her." Due to depressed and continuous suicidal ideations, patient is recommended for inpatient placement for safety.  Past Psychiatric History: No past psychiatric history on file. Patient denies.  Risk to Self:  Yes Risk to Others:  No Prior Inpatient Therapy:  Denies Prior Outpatient Therapy:  Denies  Past Medical History: No past medical history on file. No past surgical history on file. Family History: No family history on file. Family Psychiatric  History: No past family psychiatric history on file. Patient denies. Social History:  Social History   Substance and Sexual Activity  Alcohol Use Yes   Alcohol/week: 7.0 standard drinks   Types: 7 Cans of beer per week   Comment: pt states, "a beer a day"     Social History   Substance and Sexual Activity  Drug Use No    Social History   Socioeconomic History   Marital status: Single    Spouse name: Not on file   Number of children: Not on file   Years of education: Not on file   Highest education level: Not on file  Occupational History   Not on file  Tobacco Use   Smoking status: Every Day    Packs/day: 0.50    Types: Cigarettes   Smokeless tobacco: Never  Substance and Sexual Activity   Alcohol use: Yes    Alcohol/week: 7.0 standard drinks    Types: 7 Cans of beer per week    Comment: pt states, "a beer a day"   Drug use: No   Sexual activity: Yes    Birth control/protection: None  Other Topics  Concern   Not on file  Social History Narrative   Not on file   Social Determinants of Health   Financial Resource Strain: Not on file  Food Insecurity: Not on file  Transportation Needs: Not on file  Physical Activity: Not on file  Stress: Not on file  Social Connections: Not on file   Additional Social History:    Allergies:  No Known Allergies  Labs:  Results for orders placed or performed during the hospital encounter of 04/04/21 (from the past 48 hour(s))   Comprehensive metabolic panel     Status: Abnormal   Collection Time: 04/04/21 10:48 AM  Result Value Ref Range   Sodium 137 135 - 145 mmol/L   Potassium 3.7 3.5 - 5.1 mmol/L   Chloride 107 98 - 111 mmol/L   CO2 23 22 - 32 mmol/L   Glucose, Bld 89 70 - 99 mg/dL    Comment: Glucose reference range applies only to samples taken after fasting for at least 8 hours.   BUN 12 6 - 20 mg/dL   Creatinine, Ser 5.99 (H) 0.44 - 1.00 mg/dL   Calcium 9.3 8.9 - 35.7 mg/dL   Total Protein 7.9 6.5 - 8.1 g/dL   Albumin 4.6 3.5 - 5.0 g/dL   AST 19 15 - 41 U/L   ALT 17 0 - 44 U/L   Alkaline Phosphatase 65 38 - 126 U/L   Total Bilirubin 0.7 0.3 - 1.2 mg/dL   GFR, Estimated >01 >77 mL/min    Comment: (NOTE) Calculated using the CKD-EPI Creatinine Equation (2021)    Anion gap 7 5 - 15    Comment: Performed at [facility], [address] [place], [place] [address]  Ethanol     Status: Abnormal   Collection Time: 04/04/21 10:48 AM  Result Value Ref Range   Alcohol, Ethyl (B) 53 (H) <10 mg/dL    Comment: (NOTE) Lowest detectable limit for serum alcohol is 10 mg/dL.  For medical purposes only. Performed at [facility], [address] [place], [place] [address]   Salicylate level     Status: Abnormal   Collection Time: 04/04/21 10:48 AM  Result Value Ref Range   Salicylate Lvl <7.0 (L) 7.0 - 30.0 mg/dL    Comment: Performed at [facility], [address] [place], [place] [address]  Acetaminophen level     Status: Abnormal   Collection Time: 04/04/21 10:48 AM  Result Value Ref Range   Acetaminophen (Tylenol), Serum <10 (L) 10 - 30 ug/mL    Comment: (NOTE) Therapeutic concentrations vary significantly. A range of 10-30 ug/mL  may be an effective concentration for many patients. However, some  are best treated at concentrations outside of this range. Acetaminophen concentrations >150 ug/mL at 4 hours after ingestion  and >50 ug/mL at 12 hours after ingestion  are often associated with  toxic reactions.  Performed at [facility], [address] [place], [place] [address]   Urine Drug Screen, Qualitative     Status: Abnormal   Collection Time: 04/04/21 10:48 AM  Result Value Ref Range   Tricyclic, Ur Screen NONE DETECTED NONE DETECTED   Amphetamines, Ur Screen NONE DETECTED NONE DETECTED   MDMA (Ecstasy)Ur Screen NONE DETECTED NONE DETECTED   Cocaine Metabolite,Ur Echo POSITIVE (A) NONE DETECTED   Opiate, Ur Screen NONE DETECTED NONE DETECTED   Phencyclidine (PCP) Ur S NONE DETECTED NONE DETECTED   Cannabinoid 50 Ng, Ur [place] POSITIVE (A) NONE DETECTED  Barbiturates, Ur Screen NONE DETECTED NONE DETECTED   Benzodiazepine, Ur Scrn NONE DETECTED NONE DETECTED   Methadone Scn, Ur NONE DETECTED NONE DETECTED    Comment: (NOTE) Tricyclics + metabolites, urine    Cutoff 1000 ng/mL Amphetamines + metabolites, urine  Cutoff 1000 ng/mL MDMA (Ecstasy), urine              Cutoff 500 ng/mL Cocaine Metabolite, urine          Cutoff 300 ng/mL Opiate + metabolites, urine        Cutoff 300 ng/mL Phencyclidine (PCP), urine         Cutoff 25 ng/mL Cannabinoid, urine                 Cutoff 50 ng/mL Barbiturates + metabolites, urine  Cutoff 200 ng/mL Benzodiazepine, urine              Cutoff 200 ng/mL Methadone, urine                   Cutoff 300 ng/mL  The urine drug screen provides only a preliminary, unconfirmed analytical test result and should not be used for non-medical purposes. Clinical consideration and professional judgment should be applied to any positive drug screen result due to possible interfering substances. A more specific alternate chemical method must be used in order to obtain a confirmed analytical result. Gas chromatography / mass spectrometry (GC/MS) is the preferred confirm atory method. Performed at [facility], [address] [place], [place] [id number]     No current facility-administered medications  for this encounter.   No current outpatient medications on file.    Musculoskeletal: Strength & Muscle Tone: within normal limits Gait & Station: normal Patient leans: N/A  Psychiatric Specialty Exam: Physical Exam Vitals and nursing note reviewed.  Constitutional:      Appearance: Normal appearance.  HENT:     Head: Normocephalic.     Nose: Nose normal.  Musculoskeletal:        General: Normal range of motion.     Cervical back: Normal range of motion.  Neurological:     General: No focal deficit present.     Mental Status: She is alert and oriented to person, place, and time.  Psychiatric:        Attention and Perception: Attention and perception normal.        Mood and Affect: Mood is anxious and depressed.        Speech: Speech normal.        Behavior: Behavior normal. Behavior is cooperative.        Thought Content: Thought content includes suicidal ideation. Thought content includes suicidal plan.        Cognition and Memory: Cognition and memory normal.        Judgment: Judgment is impulsive.    Review of Systems  Psychiatric/Behavioral:  Positive for depression, substance abuse and suicidal ideas. The patient is nervous/anxious.   All other systems reviewed and are negative.  Blood pressure (!) 163/111, pulse 82, temperature 98.2 F (36.8 C), temperature source Oral, resp. rate 16, height 5\' 3"  (1.6 m), weight 63.5 kg, SpO2 98 %.Body mass index is 24.8 kg/m.  General Appearance: Fairly Groomed  Eye Contact:  Good  Speech:  Normal Rate  Volume:  Decreased  Mood:  Depressed  Affect:  Depressed  Thought Process:  Coherent  Orientation:  Full (Time, Place, and Person)  Thought Content:  Depressed  Suicidal Thoughts:  Yes.  with  intent/plan  Homicidal Thoughts:  No  Memory:  Intact  Judgement:  Impaired  Insight:  Lacking  Psychomotor Activity:   Normal  Concentration:  Concentration: Good and Attention Span: Good  Recall:  Good  Fund of Knowledge:  Good   Language:  Good  Akathisia:  No  Handed:  Right  AIMS (if indicated):     Assets:  Others:  None noted  ADL's:  Intact  Cognition:  WNL  Sleep:        Physical Exam: Physical Exam Vitals and nursing note reviewed.  Constitutional:      Appearance: Normal appearance.  HENT:     Head: Normocephalic.     Nose: Nose normal.  Musculoskeletal:        General: Normal range of motion.     Cervical back: Normal range of motion.  Neurological:     General: No focal deficit present.     Mental Status: She is alert and oriented to person, place, and time.  Psychiatric:        Attention and Perception: Attention and perception normal.        Mood and Affect: Mood is anxious and depressed.        Speech: Speech normal.        Behavior: Behavior normal. Behavior is cooperative.        Thought Content: Thought content includes suicidal ideation. Thought content includes suicidal plan.        Cognition and Memory: Cognition and memory normal.        Judgment: Judgment is impulsive.   Review of Systems  Psychiatric/Behavioral:  Positive for depression, substance abuse and suicidal ideas. The patient is nervous/anxious.   All other systems reviewed and are negative. Blood pressure (!) 163/111, pulse 82, temperature 98.2 F (36.8 C), temperature source Oral, resp. rate 16, height 5\' 3"  (1.6 m), weight 63.5 kg, SpO2 98 %. Body mass index is 24.8 kg/m.  Treatment Plan Summary: Daily contact with patient to assess and evaluate symptoms and progress in treatment, Medication management, and Plan : Major depressive disorder, single episode, severe without psychosis: Due to acuity of illness and threat to self-harm, patient is recommended to come into the hospital for inpatient treatment and therapy.  Medication: deferred to admission on the unit.  Disposition: Recommend psychiatric Inpatient admission when medically cleared. Discussed crisis plan and support team options. Admitted to [facility]  psychiatric hospital admission.  [facility], NP 04/04/2021 12:14 PM

## 2021-04-04 NOTE — Progress Notes (Signed)
Patient is isolative to her room. She is pleasant and cooperative. She denies SI/HI/AVH at this time. She denies depression and anxiety although she presents sad. She is contracts for safety and will continue to monitor with q 15 min safety rounds. Encouraged her to seek staff with any concerns.   Cleo Butler-Nicholson,LPN

## 2021-04-04 NOTE — ED Notes (Signed)
Patient dressed out by this RN and Annie,RN in hospital provided scrubs. Pt belongings placed in belongings bag and labeled with pt label and green tag. Pt had with them the following belongings:   1 pair of multi color croc type shoes  1 pair of blue and gray pants 1 black shirt 1 black bra 1 tan, blue, and purple head wrap   Pt denies having a cell phone, keys, or wallet in her belongings. Pt belongings taken with patient to quad. Pt calm and cooperative.

## 2021-04-04 NOTE — ED Notes (Signed)
Report called and given to receiving nurse. Patient to be transferred to lower level BHU

## 2021-04-04 NOTE — Progress Notes (Signed)
Pt alert, and oriented X4 during admission process. Pt currently denies SI/HI, A/VH, and any pain. Pt is calm and cooperative. Education, support, reassurance, and encouragement provided, q15 minute safety checks initiated. Pt's belongings secured in locker. Pt denies any concerns at this time and verbally contracts for safety. Pt ambulating on the unit with no issues. Pt remains safe on and off the unit.

## 2021-04-05 ENCOUNTER — Encounter: Payer: Self-pay | Admitting: Psychiatry

## 2021-04-05 LAB — PREGNANCY, URINE: Preg Test, Ur: NEGATIVE

## 2021-04-05 LAB — TSH: TSH: 0.514 u[IU]/mL (ref 0.350–4.500)

## 2021-04-05 MED ORDER — LISINOPRIL 5 MG PO TABS
10.0000 mg | ORAL_TABLET | Freq: Every day | ORAL | Status: DC
  Administered 2021-04-05 – 2021-04-08 (×4): 10 mg via ORAL
  Filled 2021-04-05 (×4): qty 2

## 2021-04-05 MED ORDER — THIAMINE HCL 100 MG/ML IJ SOLN: 100.0000 mg | Freq: Every day | INTRAMUSCULAR | Status: DC

## 2021-04-05 MED ORDER — FOLIC ACID 1 MG PO TABS
1.0000 mg | ORAL_TABLET | Freq: Every day | ORAL | Status: DC
  Administered 2021-04-05 – 2021-04-08 (×4): 1 mg via ORAL
  Filled 2021-04-05 (×4): qty 1

## 2021-04-05 MED ORDER — HYDROXYZINE HCL 25 MG PO TABS
25.0000 mg | ORAL_TABLET | Freq: Four times a day (QID) | ORAL | Status: DC | PRN
Start: 2021-04-05 — End: 2021-04-08
  Administered 2021-04-07: 25 mg via ORAL
  Filled 2021-04-05: qty 1

## 2021-04-05 MED ORDER — NICOTINE 14 MG/24HR TD PT24
14.0000 mg | MEDICATED_PATCH | Freq: Every day | TRANSDERMAL | Status: DC
  Administered 2021-04-05 – 2021-04-08 (×4): 14 mg via TRANSDERMAL
  Filled 2021-04-05 (×4): qty 1

## 2021-04-05 MED ORDER — LORAZEPAM 1 MG PO TABS
1.0000 mg | ORAL_TABLET | Freq: Four times a day (QID) | ORAL | Status: DC | PRN
  Filled 2021-04-05: qty 1

## 2021-04-05 MED ORDER — ESCITALOPRAM OXALATE 10 MG PO TABS
5.0000 mg | ORAL_TABLET | Freq: Every day | ORAL | Status: AC
  Administered 2021-04-05: 5 mg via ORAL
  Filled 2021-04-05: qty 1

## 2021-04-05 MED ORDER — ESCITALOPRAM OXALATE 10 MG PO TABS
10.0000 mg | ORAL_TABLET | Freq: Every day | ORAL | Status: DC
  Administered 2021-04-06 – 2021-04-08 (×3): 10 mg via ORAL
  Filled 2021-04-05 (×3): qty 1

## 2021-04-05 MED ORDER — LORAZEPAM 1 MG PO TABS
1.0000 mg | ORAL_TABLET | ORAL | Status: DC | PRN
  Administered 2021-04-05: 2 mg via ORAL
  Administered 2021-04-05 – 2021-04-06 (×2): 1 mg via ORAL
  Filled 2021-04-05: qty 1
  Filled 2021-04-05: qty 2

## 2021-04-05 MED ORDER — LOPERAMIDE HCL 2 MG PO CAPS: 2.0000 mg | ORAL_CAPSULE | ORAL | Status: AC | PRN

## 2021-04-05 MED ORDER — METOPROLOL TARTRATE 25 MG PO TABS
12.5000 mg | ORAL_TABLET | Freq: Two times a day (BID) | ORAL | Status: DC
  Administered 2021-04-05: 12.5 mg via ORAL
  Filled 2021-04-05: qty 1

## 2021-04-05 MED ORDER — ADULT MULTIVITAMIN W/MINERALS CH
1.0000 | ORAL_TABLET | Freq: Every day | ORAL | Status: DC
  Administered 2021-04-06 – 2021-04-08 (×3): 1 via ORAL
  Filled 2021-04-05 (×3): qty 1

## 2021-04-05 MED ORDER — ADULT MULTIVITAMIN W/MINERALS CH
1.0000 | ORAL_TABLET | Freq: Every day | ORAL | Status: DC
  Administered 2021-04-05: 1 via ORAL
  Filled 2021-04-05: qty 1

## 2021-04-05 MED ORDER — THIAMINE HCL 100 MG PO TABS
100.0000 mg | ORAL_TABLET | Freq: Every day | ORAL | Status: DC
  Administered 2021-04-05 – 2021-04-08 (×4): 100 mg via ORAL
  Filled 2021-04-05 (×4): qty 1

## 2021-04-05 MED ORDER — LORAZEPAM 2 MG/ML IJ SOLN: 1.0000 mg | INTRAMUSCULAR | Status: DC | PRN

## 2021-04-05 MED ORDER — NICOTINE POLACRILEX 2 MG MT GUM
2.0000 mg | CHEWING_GUM | OROMUCOSAL | Status: DC | PRN
  Administered 2021-04-05 – 2021-04-08 (×8): 2 mg via ORAL
  Filled 2021-04-05 (×8): qty 1

## 2021-04-05 MED ORDER — ONDANSETRON 4 MG PO TBDP
4.0000 mg | ORAL_TABLET | Freq: Four times a day (QID) | ORAL | Status: AC | PRN
Start: 2021-04-05 — End: 2021-04-08

## 2021-04-05 MED ORDER — THIAMINE HCL 100 MG PO TABS: 100.0000 mg | ORAL_TABLET | Freq: Every day | ORAL | Status: DC

## 2021-04-05 NOTE — Progress Notes (Signed)
Recreation Therapy Notes  Date: 04/05/2021  Time: 10:20 am   Location: Courtyard  Behavioral response: N/A   Intervention Topic: Social Skills   Discussion/Intervention: Patient did not attend group.   Clinical Observations/Feedback:  Patient did not attend group.   Maurico Perrell LRT/CTRS        Arnetra Terris 04/05/2021 11:59 AM

## 2021-04-05 NOTE — Progress Notes (Signed)
Recreation Therapy Notes  INPATIENT RECREATION TR PLAN  Patient Details Name: Isabella Clements MRN: [id number] DOB: 01-23-1986 Today's Date: 04/05/2021  Rec Therapy Plan Is patient appropriate for Therapeutic Recreation?: Yes Treatment times per week: at least 3 Estimated Length of Stay: 5-7 days TR Treatment/Interventions: Group participation (Comment)  Discharge Criteria Pt will be discharged from therapy if:: Discharged Treatment plan/goals/alternatives discussed and agreed upon by:: Patient/family  Discharge Summary     Nicholaos Schippers 04/05/2021, 3:30 PM
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
## 2021-04-05 NOTE — B[facility] Suicide Risk Assessment (Signed)
B[facility] INPATIENT:  Family/Significant Other Suicide Prevention Education  Suicide Prevention Education:  SPE completed with pt, as pt refused to consent to family contact. SPI pamphlet provided to pt and pt was encouraged to share information with support network, ask questions, and talk about any concerns relating to SPE. Pt denies access to guns/firearms and verbalized understanding of information provided. Mobile Crisis information also provided to pt.  Patient Refusal for Family/Significant Other Suicide Prevention Education: The patient Isabella Clements has refused to provide written consent for family/significant other to be provided Family/Significant Other Suicide Prevention Education during admission and/or prior to discharge.  Physician notified.  Avrum Kimball A [place] 04/05/2021, 3:53 PM

## 2021-04-05 NOTE — B[facility] Group Notes (Signed)
LCSW Group Therapy Note     04/05/2021 2:29 PM     Type of Therapy and Topic:  Group Therapy:  Overcoming Obstacles     Participation Level:  Did Not Attend     Description of Group:     In this group patients will be encouraged to explore what they see as obstacles to their own wellness and recovery. They will be guided to discuss their thoughts, feelings, and behaviors related to these obstacles. The group will process together ways to cope with barriers, with attention given to specific choices patients can make. Each patient will be challenged to identify changes they are motivated to make in order to overcome their obstacles. This group will be process-oriented, with patients participating in exploration of their own experiences as well as giving and receiving support and challenge from other group members.     Therapeutic Goals:  1.    Patient will identify personal and current obstacles as they relate to admission.  2.    Patient will identify barriers that currently interfere with their wellness or overcoming obstacles.  3.    Patient will identify feelings, thought process and behaviors related to these barriers.  4.    Patient will identify two changes they are willing to make to overcome these obstacles:        Summary of Patient Progress: X     Therapeutic Modalities:    Cognitive Behavioral Therapy  Solution Focused Therapy  Motivational Interviewing  Relapse Prevention Therapy     Rodnesha Elie [place], MSW, LCSW-A  04/05/2021 2:29 PM

## 2021-04-05 NOTE — [facility] Suicide Risk Assessment (Signed)
[facility] Admission Suicide Risk Assessment   Nursing information obtained from:  Patient Demographic factors:  NA Current Mental Status:  Suicidal ideation indicated by patient, Suicidal ideation indicated by others Loss Factors:  Financial problems / change in socioeconomic status, Loss of significant relationship Historical Factors:  Anniversary of important loss Risk Reduction Factors:  Living with another person, especially a relative  Total Time spent with patient: 1 hour Principal Problem: Major depressive disorder, single episode, severe without psychosis (HCC) Diagnosis:  Principal Problem:   Major depressive disorder, single episode, severe without psychosis (HCC) Active Problems:   Alcohol use disorder, moderate, dependence (HCC)   Cocaine abuse (HCC)  Subjective Data: 35 year old female presenting for depression and suicidal ideations.  See H&P for details  Continued Clinical Symptoms:  Alcohol Use Disorder Identification Test Final Score (AUDIT): 7 The "Alcohol Use Disorders Identification Test", Guidelines for Use in Primary Care, Second Edition.  World Science writer [facility]). Score between 0-7:  no or low risk or alcohol related problems. Score between 8-15:  moderate risk of alcohol related problems. Score between 16-19:  high risk of alcohol related problems. Score 20 or above:  warrants further diagnostic evaluation for alcohol dependence and treatment.   CLINICAL FACTORS:   Depression:   Anhedonia Comorbid alcohol abuse/dependence Hopelessness Insomnia Severe Alcohol/Substance Abuse/Dependencies Unstable or Poor Therapeutic Relationship   Musculoskeletal: Strength & Muscle Tone: within normal limits Gait & Station: normal Patient leans: N/A  Psychiatric Specialty Exam:  Presentation  General Appearance: Appropriate for Environment  Eye Contact:Good  Speech:Clear and Coherent; Slow  Speech Volume:Normal  Handedness: No data recorded  Mood and  Affect  Mood:Depressed; Anxious  Affect:Depressed; Congruent   Thought Process  Thought Processes:Coherent  Descriptions of Associations:No data recorded Orientation:Full (Time, Place and Person)  Thought Content:WDL  History of Schizophrenia/Schizoaffective disorder:No  Duration of Psychotic Symptoms:No data recorded Hallucinations:Hallucinations: None (Denies)  Ideas of Reference:None (Denies)  Suicidal Thoughts:Suicidal Thoughts: No (Denies)  Homicidal Thoughts:Homicidal Thoughts: No (Denies)   Sensorium  Memory:Immediate Good; Recent Fair  Judgment:Fair  Insight:Fair   Executive Functions  Concentration:Fair  Attention Span:Good  Recall:Good  Fund of Knowledge:Good  Language:Good   Psychomotor Activity  Psychomotor Activity:Psychomotor Activity: Decreased   Assets  Assets:Communication Skills; Desire for Improvement; Financial Resources/Insurance; Housing; Social Support; Resilience; Physical Health; Vocational/Educational; Leisure Time   Sleep  Sleep:Sleep: Fair Number of Hours of Sleep: 7    Physical Exam: Physical Exam ROS Blood pressure (!) 160/104, pulse 62, temperature 98.3 F (36.8 C), temperature source Oral, resp. rate 16, height 5\' 3"  (1.6 m), weight 63.5 kg, SpO2 100 %. Body mass index is 24.8 kg/m.   COGNITIVE FEATURES THAT CONTRIBUTE TO RISK:  Polarized thinking and Thought constriction (tunnel vision)    SUICIDE RISK:   Moderate:  Frequent suicidal ideation with limited intensity, and duration, some specificity in terms of plans, no associated intent, good self-control, limited dysphoria/symptomatology, some risk factors present, and identifiable protective factors, including available and accessible social support.  PLAN OF CARE: Continue inpatient hospitalization see H&P for details.  I certify that inpatient services furnished can reasonably be expected to improve the patient's condition.   ,  MD 04/05/2021, 12:28 PM
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
## 2021-04-05 NOTE — Plan of Care (Signed)
Patient stated that it is time to get help for her drug and alcohol use. Patient verbalized minimal withdrawal symptoms.Ativan x 1 given. Denies SI,HI and AVH. Stays in bed most of the shift. Appetite good.Energy level fair. Compliant with medications. Support and encouragement given.

## 2021-04-05 NOTE — Progress Notes (Signed)
Recreation Therapy Notes  INPATIENT RECREATION THERAPY ASSESSMENT  Patient Details Name: Isabella Clements MRN: [id number] DOB: [birth year] Today's Date: 04/05/2021       Information Obtained From: Patient  Able to Participate in Assessment/Interview: Yes  Patient Presentation: Responsive, Oriented  Reason for Admission (Per Patient): Active Symptoms  Patient Stressors:    Coping Skills:   Substance Abuse  Leisure Interests (2+):  Music - Listen (Smoke, Drink)  Frequency of Recreation/Participation: Weekly  Awareness of Community Resources:  Yes  Community Resources:  Restaurants  Current Use: No  If no, Barriers?: Transportation, Surveyor, quantity  Expressed Interest in [employer] Information: Yes  County [place] Residence:  [place]  Patient Main Form of Transportation: Other (Comment) (Friends)  Patient Strengths:  Big heart  Patient Identified Areas of Improvement:  Quit smoking  Patient Goal for Hospitalization:  Be honest with myself, help with my depression  Current SI (including self-harm):  No  Current HI:  No  Current AVH: No  Staff Intervention Plan: Group Attendance, Collaborate with Interdisciplinary Treatment Team  Consent to Intern Participation: N/A  Halina Asano 04/05/2021, 3:29 PM

## 2021-04-05 NOTE — Progress Notes (Signed)
Patient vitals obtained this morning was 162/119 sitting and 152/112 standing and lying 170/104 morning staff  made aware of situation.  Cleo Butler-Nicholson, LPn

## 2021-04-05 NOTE — [facility] Counselor (Signed)
CSW attempted to complete clinical assessment with pt. Pt said she needed CSW to come back because she was too sleepy and tired to answer all the questions. CSW will follow back up later in the day.  Nazyia Gaugh [place], MSW, LCSW-A 8/1/202210:25 AM

## 2021-04-05 NOTE — Tx Team (Signed)
Interdisciplinary Treatment and Diagnostic Plan Update  04/05/2021 Time of Session: 0900 Isabella Clements MRN: [id number]  Principal Diagnosis: <principal problem not specified>  Secondary Diagnoses: Active Problems:   Major depressive disorder, single episode, severe without psychosis ([place])   Current Medications:  Current Facility-Administered Medications  Medication Dose Route Frequency Provider Last Rate Last Admin   acetaminophen (TYLENOL) tablet 650 mg  650 mg Oral Q6H PRN Patrecia Pour, NP   650 mg at 04/05/21 0916   alum & mag hydroxide-simeth (MAALOX/MYLANTA) 200-200-20 MG/5ML suspension 30 mL  30 mL Oral Q4H PRN Patrecia Pour, NP       magnesium hydroxide (MILK OF MAGNESIA) suspension 30 mL  30 mL Oral Daily PRN Patrecia Pour, NP       PTA Medications: No medications prior to admission.    Patient Stressors: Financial difficulties Marital or family conflict Substance abuse  Patient Strengths: Ability for insight Capable of independent living Supportive family/friends  Treatment Modalities: Medication Management, Group therapy, Case management,  1 to 1 session with clinician, Psychoeducation, Recreational therapy.   Physician Treatment Plan for Primary Diagnosis: <principal problem not specified> Long Term Goal(s):     Short Term Goals:    Medication Management: Evaluate patient's response, side effects, and tolerance of medication regimen.  Therapeutic Interventions: 1 to 1 sessions, Unit Group sessions and Medication administration.  Evaluation of Outcomes: Not Met  Physician Treatment Plan for Secondary Diagnosis: Active Problems:   Major depressive disorder, single episode, severe without psychosis ([place])  Long Term Goal(s):     Short Term Goals:       Medication Management: Evaluate patient's response, side effects, and tolerance of medication regimen.  Therapeutic Interventions: 1 to 1 sessions, Unit Group sessions and Medication  administration.  Evaluation of Outcomes: Not Met   RN Treatment Plan for Primary Diagnosis: <principal problem not specified> Long Term Goal(s): Knowledge of disease and therapeutic regimen to maintain health will improve  Short Term Goals: Ability to remain free from injury will improve, Ability to verbalize frustration and anger appropriately will improve, Ability to demonstrate self-control, Ability to participate in decision making will improve, Ability to verbalize feelings will improve, Ability to disclose and discuss suicidal ideas, Ability to identify and develop effective coping behaviors will improve, and Compliance with prescribed medications will improve  Medication Management: RN will administer medications as ordered by provider, will assess and evaluate patient's response and provide education to patient for prescribed medication. RN will report any adverse and/or side effects to prescribing provider.  Therapeutic Interventions: 1 on 1 counseling sessions, Psychoeducation, Medication administration, Evaluate responses to treatment, Monitor vital signs and CBGs as ordered, Perform/monitor CIWA, COWS, AIMS and Fall Risk screenings as ordered, Perform wound care treatments as ordered.  Evaluation of Outcomes: Not Met   LCSW Treatment Plan for Primary Diagnosis: <principal problem not specified> Long Term Goal(s): Safe transition to appropriate next level of care at discharge, Engage patient in therapeutic group addressing interpersonal concerns.  Short Term Goals: Engage patient in aftercare planning with referrals and resources, Increase social support, Increase ability to appropriately verbalize feelings, Increase emotional regulation, Facilitate acceptance of mental health diagnosis and concerns, Facilitate patient progression through stages of change regarding substance use diagnoses and concerns, Identify triggers associated with mental health/substance abuse issues, and Increase  skills for wellness and recovery  Therapeutic Interventions: Assess for all discharge needs, 1 to 1 time with Social worker, Explore available resources and support systems, Assess for adequacy in community  support network, Educate family and significant other(s) on suicide prevention, Complete Psychosocial Assessment, Interpersonal group therapy.  Evaluation of Outcomes: Not Met   Progress in Treatment: Attending groups: No. Participating in groups: No. Taking medication as prescribed: Yes. Toleration medication: Yes. Family/Significant other contact made: No, will contact:  CSW to obtain consent. Patient understands diagnosis: Yes. Discussing patient identified problems/goals with staff: Yes. Medical problems stabilized or resolved: Yes. Denies suicidal/homicidal ideation: No. Issues/concerns per patient self-inventory: Yes. Other: none  New problem(s) identified: Yes, Describe:  "dealing with depression . . . Not been able to talk to anyone. . . Do not have kids or friends."   New Short Term/Long Term Goal(s): detox, elimination of symptoms of psychosis, medication management for mood stabilization; elimination of SI thoughts; development of comprehensive mental wellness/sobriety plan.  Patient Goals:  Patient shared that she wants to be free from substance use.   Discharge Plan or Barriers: No foreseen barriers to discharge.   Reason for Continuation of Hospitalization: Anxiety Depression  Estimated Length of Stay: 1-7 days  Attendees: Patient: Isabella Clements 04/05/2021 9:38 AM  Physician: Selina Cooley, MD 04/05/2021 9:38 AM  Nursing: Tyler Pita, RN  04/05/2021 9:38 AM  RN Care Manager: 04/05/2021 9:38 AM  Social Worker: Paulla Dolly, MSW, [place], [place]  04/05/2021 9:38 AM  Recreational Therapist: Devin Going, LRT  04/05/2021 9:38 AM  Other: Assunta Curtis, MSW, LCSW 04/05/2021 9:38 AM  Other: Kiva Martinique, [place] 04/05/2021 9:38 AM  Other:Louise Roseau, NP 04/05/2021  9:38 AM    Scribe for Treatment Team: Durenda Hurt, [place] 04/05/2021 9:38 AM

## 2021-04-05 NOTE — [facility] Group Notes (Signed)
[facility] Group Notes:  (Nursing/MHT/Case Management/Adjunct)  Date:  04/05/2021  Time:  10:25 PM  Type of Therapy:  Group Therapy  Participation Level:  Active  Participation Quality:  Appropriate  Affect:  Appropriate  Cognitive:  Alert  Insight:  Good  Engagement in Group:  Engaged and no goals and asking about her menu she didn't fill out.  Modes of Intervention:  Support  Summary of Progress/Problems:  Isabella Clements 04/05/2021, 10:25 PM

## 2021-04-06 LAB — CBC WITH DIFFERENTIAL/PLATELET
Abs Immature Granulocytes: 0.05 10*3/uL (ref 0.00–0.07)
Basophils Absolute: 0.1 10*3/uL (ref 0.0–0.1)
Basophils Relative: 1 %
Eosinophils Absolute: 0.3 10*3/uL (ref 0.0–0.5)
Eosinophils Relative: 3 %
Hemoglobin: 15.7 g/dL — ABNORMAL HIGH (ref 12.0–15.0)
Immature Granulocytes: 1 %
Lymphocytes Relative: 28 %
Lymphs Abs: 3.1 10*3/uL (ref 0.7–4.0)
Monocytes Absolute: 0.8 10*3/uL (ref 0.1–1.0)
Monocytes Relative: 7 %
Neutro Abs: 6.7 10*3/uL (ref 1.7–7.7)
Neutrophils Relative %: 60 %
Platelets: 343 10*3/uL (ref 150–400)
Smear Review: NORMAL
WBC: 11 10*3/uL — ABNORMAL HIGH (ref 4.0–10.5)
nRBC: 0 % (ref 0.0–0.2)

## 2021-04-06 MED ORDER — LORAZEPAM 1 MG PO TABS
1.0000 mg | ORAL_TABLET | ORAL | Status: AC | PRN
  Administered 2021-04-06 (×2): 1 mg via ORAL
  Administered 2021-04-06 – 2021-04-07 (×2): 2 mg via ORAL
  Administered 2021-04-07: 1 mg via ORAL
  Filled 2021-04-06: qty 1
  Filled 2021-04-06: qty 2
  Filled 2021-04-06 (×2): qty 1
  Filled 2021-04-06: qty 2

## 2021-04-06 MED ORDER — LORAZEPAM 2 MG/ML IJ SOLN
1.0000 mg | INTRAMUSCULAR | Status: AC | PRN
Start: 2021-04-06 — End: 2021-04-08

## 2021-04-06 NOTE — Progress Notes (Signed)
Recreation Therapy Notes   Date: 04/06/2021  Time: 10:00 am   Location: Courtyard   Behavioral response: N/A   Intervention Topic: Wellness    Discussion/Intervention: Patient did not attend group.   Clinical Observations/Feedback:  Patient did not attend group.   Andres Vest LRT/CTRS        Katianne Barre 04/06/2021 1:05 PM

## 2021-04-06 NOTE — Progress Notes (Signed)
[facility] MD Progress Note  04/06/2021 5:36 PM SYMPHANIE Clements  MRN:  [id number]  CC: " I just need to get out of here and start working.  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  Continued to make attempts, inpatient was able to speak to me late this afternoon.  Her speech is slow, as it was yesterday.  She appears a bit distracted.  Interview held immediately after patient came to nurse asking for Tylenol and Ativan for headache and anxiety.  She talks again about needing just to get to the [facility] to get a ID card and her mother refusing to take her.  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  Patient states that she does not think her family cares about her and "I have never asked my mom for anything."  I attempted to challenge that, and she did admit that her mother and sister had tried to get her into rehab before.  Informed patient that I did speak with mother and mother asked me to relay that the whole family loved her and would like her to call.  Patient states that she will call. Patient is still unclear if she wants to go to residential treatment.  She feels that she can do this herself because she wants to get back to work.  Advised patient that she really does need to speak with social work to get resources on various treatment programs.  Patient states that she wants to be discharged tomorrow.  Discussed with patient that she still does not appear stable for discharge, as she is still needing Ativan and blood pressures still elevated at times.  Repeat CBC was ordered.  However, differential still comes up as "results unavailable due to interfering substance."  Will contact lab for verification.  Patient still ambivalent about starting naltrexone, but not particularly opposed.  We will reevaluate  tomorrow to see if blood pressures remain more stable and she is not needing Ativan as often. Patient endorses anxiety.  She denies suicidal or homicidal ideation.  Denies auditory or visual hallucinations, paranoia.  Patient has poor appetite.  Encouraged patient to eat and take in fluids.  Patient states that she is urinating.  Patient has been sleeping most of the day and states that she slept last night.  She is not attending groups during the day.  She states that she does not like to be around people.  Encouraged patient to speak to social worker tomorrow to get more information on substance misuse rehabilitation programs.  Encouraged patient to engage in milieu activities and at discharge, therapy. Patient agrees.   Collateral from mother, Isabella Clements [phone].  Mother states that patient has misused drugs and alcohol for many years.  She states that patient's father did pass away when patient was 51 years old.  Mother feels that patient did quite well in school and did graduate high school.  Patient then got a job and moved out.  Since that time patient has moved in and out of mother's house.  Mother was aware of patient's drug and alcohol issues.  Patient would be away for several days, then come home for a few days.  This past Saturday, mother states that patient was fine until she left for a  while.  Then she came back, grabbed a knife, and told her mother that she wanted to kill herself.  Mother did call the police, who brought patient to the hospital.  Mother states patient "wants to blame everyone else for her problems."  Mother states that she tried to get patient into rehab program in the spring of this year, but patient refused to go.  Mother's perspective is that illicit drugs are more of a problem than alcohol.  Mother expressed her support for patient to get rehabilitation therapy.   Principal Problem: Major depressive disorder, single episode, severe without psychosis  (HCC) Diagnosis: Principal Problem:   Major depressive disorder, single episode, severe without psychosis (H[facility]) Active Problems:   Alcohol use disorder, moderate, dependence (HCC)   Cocaine abuse (HCC)  Total Time spent with patient: 20 minutes  Past Psychiatric History: Patient describes longstanding history of depression and substance use.  Has had no treatment for either.    Past Medical History: History reviewed. No pertinent past medical history. History reviewed. No pertinent surgical history. Family History: History reviewed. No pertinent family history. Family Psychiatric  History: Denies Social History:  Social History   Substance and Sexual Activity  Alcohol Use Yes   Alcohol/week: 7.0 standard drinks   Types: 7 Cans of beer per week   Comment: pt states, "a beer a day"     Social History   Substance and Sexual Activity  Drug Use No    Social History   Socioeconomic History   Marital status: Single    Spouse name: Not on file   Number of children: Not on file   Years of education: Not on file   Highest education level: Not on file  Occupational History   Not on file  Tobacco Use   Smoking status: Every Day    Packs/day: 0.50    Types: Cigarettes   Smokeless tobacco: Never  Substance and Sexual Activity   Alcohol use: Yes    Alcohol/week: 7.0 standard drinks    Types: 7 Cans of beer per week    Comment: pt states, "a beer a day"   Drug use: No   Sexual activity: Yes    Birth control/protection: None  Other Topics Concern   Not on file  Social History Narrative   Not on file   Social Determinants of Health   Financial Resource Strain: Not on file  Food Insecurity: Not on file  Transportation Needs: Not on file  Physical Activity: Not on file  Stress: Not on file  Social Connections: Not on file   Additional Social History:      Sleep: Good  Appetite:  Poor  Current Medications: Current Facility-Administered Medications  Medication  Dose Route Frequency Provider Last Rate Last Admin   acetaminophen (TYLENOL) tablet 650 mg  650 mg Oral Q6H PRN Charm Rings, NP   650 mg at 04/06/21 1621   alum & mag hydroxide-simeth (MAALOX/MYLANTA) 200-200-20 MG/5ML suspension 30 mL  30 mL Oral Q4H PRN Charm Rings, NP       escitalopram (LEXAPRO) tablet 10 mg  10 mg Oral Daily Gabriel Cirri F, NP   10 mg at 04/06/21 9562   folic acid (FOLVITE) tablet 1 mg  1 mg Oral Daily Jesse Sans, MD   1 mg at 04/06/21 1308   hydrOXYzine (ATARAX/VISTARIL) tablet 25 mg  25 mg Oral Q6H PRN Gabriel Cirri F, NP       lisinopril (ZESTRIL) tablet 10 mg  10 mg Oral Daily Jesse SansFreeman, Megan M, MD   10 mg at 04/06/21 [id number]   loperamide (IMODIUM) capsule 2-4 mg  2-4 mg Oral PRN Vanetta MuldersBarthold, Najeeb Uptain F, NP       LORazepam (ATIVAN) injection 1-4 mg  1-4 mg Intramuscular Q1H PRN Jesse SansFreeman, Megan M, MD       Or   LORazepam (ATIVAN) tablet 1-4 mg  1-4 mg Oral Q1H PRN Jesse SansFreeman, Megan M, MD   1 mg at 04/06/21 1621   magnesium hydroxide (MILK OF MAGNESIA) suspension 30 mL  30 mL Oral Daily PRN Charm RingsLord, Jamison Y, NP       multivitamin with minerals tablet 1 tablet  1 tablet Oral Daily Jesse SansFreeman, Megan M, MD   1 tablet at 04/06/21 [id number]   nicotine (NICODERM CQ - dosed in mg/24 hours) patch 14 mg  14 mg Transdermal Daily Gabriel CirriBarthold, Nilay Mangrum F, NP   14 mg at 04/06/21 [id number]   nicotine polacrilex (NICORETTE) gum 2 mg  2 mg Oral PRN Vanetta MuldersBarthold, Angell Honse F, NP   2 mg at 04/06/21 1621   ondansetron (ZOFRAN-ODT) disintegrating tablet 4 mg  4 mg Oral Q6H PRN Gabriel CirriBarthold, Carri Spillers F, NP       thiamine tablet 100 mg  100 mg Oral Daily Les PouFreeman, Megan M, MD   100 mg at 04/06/21 [id number]   Or   thiamine (B-1) injection 100 mg  100 mg Intravenous Daily Jesse SansFreeman, Megan M, MD        Lab Results:  Results for orders placed or performed during the hospital encounter of 04/04/21 (from the past 48 hour(s))  TSH     Status: None   Collection Time: 04/05/21 10:48 AM  Result Value Ref Range   TSH 0.514 0.350 -  4.500 uIU/mL    Comment: Performed by a 3rd Generation assay with a functional sensitivity of <=0.01 uIU/mL. Performed at [facility], [address] [place], [place] [address]   Pregnancy, urine     Status: None   Collection Time: 04/05/21  3:00 PM  Result Value Ref Range   Preg Test, Ur NEGATIVE NEGATIVE    Comment: Performed at [facility], [address] [place], [place] [address]  CBC with Differential/Platelet     Status: Abnormal   Collection Time: 04/06/21  7:29 AM  Result Value Ref Range   WBC 11.0 (H) 4.0 - 10.5 K/uL   RBC RESULTS UNAVAILABLE DUE TO INTERFERING SUBSTANCE 3.87 - 5.11 MIL/uL   Hemoglobin 15.7 (H) 12.0 - 15.0 g/dL   HCT RESULTS UNAVAILABLE DUE TO INTERFERING SUBSTANCE 36.0 - 46.0 %   MCV RESULTS UNAVAILABLE DUE TO INTERFERING SUBSTANCE 80.0 - 100.0 fL   MCH RESULTS UNAVAILABLE DUE TO INTERFERING SUBSTANCE 26.0 - 34.0 pg   MCHC RESULTS UNAVAILABLE DUE TO INTERFERING SUBSTANCE 30.0 - 36.0 g/dL   RDW RESULTS UNAVAILABLE DUE TO INTERFERING SUBSTANCE 11.5 - 15.5 %   Platelets 343 150 - 400 K/uL   nRBC 0.0 0.0 - 0.2 %   Neutrophils Relative % 60 %   Neutro Abs 6.7 1.7 - 7.7 K/uL   Lymphocytes Relative 28 %   Lymphs Abs 3.1 0.7 - 4.0 K/uL   Monocytes Relative 7 %   Monocytes Absolute 0.8 0.1 - 1.0 K/uL   Eosinophils Relative 3 %   Eosinophils Absolute 0.3 0.0 - 0.5 K/uL   Basophils Relative 1 %   Basophils Absolute 0.1 0.0 - 0.1 K/uL   RBC Morphology MORPHOLOGY UNREMARKABLE    Smear Review  Normal platelet morphology    Immature Granulocytes 1 %   Abs Immature Granulocytes 0.05 0.00 - 0.07 K/uL   Reactive, Benign Lymphocytes PRESENT     Comment: Performed at [facility], [address] [place], [place] [address]    Blood Alcohol level:  Lab Results  Component Value Date   ETH 53 (H) 04/04/2021    Metabolic Disorder Labs: No results found for: HGBA1C, MPG No results found for: PROLACTIN No results found for: CHOL,  TRIG, HDL, CHOLHDL, VLDL, LDLCALC  Physical Findings: AIMS:  , ,  ,  ,    CIWA:  CIWA-Ar Total: 7 COWS:     Musculoskeletal: Strength & Muscle Tone: within normal limits Gait & Station: normal Patient leans: N/A  Psychiatric Specialty Exam:  Presentation  General Appearance: Appropriate for Environment  Eye Contact:Good  Speech:Clear and Coherent; Slow  Speech Volume:Normal  Handedness: No data recorded  Mood and Affect  Mood:Depressed; Anxious  Affect:Depressed; Congruent   Thought Process  Thought Processes:Coherent  Descriptions of Associations:No data recorded Orientation:Full (Time, Place and Person)  Thought Content:WDL  History of Schizophrenia/Schizoaffective disorder:No  Duration of Psychotic Symptoms:No data recorded Hallucinations:Hallucinations: None (Denies)  Ideas of Reference:None (Denies)  Suicidal Thoughts:Suicidal Thoughts: No (Denies)  Homicidal Thoughts:Homicidal Thoughts: No (Denies)   Sensorium  Memory:Immediate Good; Recent Fair  Judgment:Fair  Insight:Fair   Executive Functions  Concentration:Fair  Attention Span:Good  Recall:Good  Fund of Knowledge:Good  Language:Good   Psychomotor Activity  Psychomotor Activity:Psychomotor Activity: Decreased   Assets  Assets:Communication Skills; Desire for Improvement; Financial Resources/Insurance; Housing; Social Support; Resilience; Physical Health; Vocational/Educational; Leisure Time   Sleep  Sleep:Sleep: Fair Number of Hours of Sleep: 7    Physical Exam: Physical Exam Vitals and nursing note reviewed.  HENT:     Head: Normocephalic.     Nose: No congestion or rhinorrhea.  Eyes:     General:        Right eye: No discharge.        Left eye: No discharge.  Cardiovascular:     Rate and Rhythm: Normal rate.  Pulmonary:     Effort: Pulmonary effort is normal.  Musculoskeletal:        General: Normal range of motion.     Cervical back: Normal range of  motion.  Neurological:     Mental Status: She is alert and oriented to person, place, and time.  Psychiatric:        Mood and Affect: Mood is depressed.        Speech: Speech is delayed.        Behavior: Behavior is cooperative.        Judgment: Judgment normal.   Review of Systems  Psychiatric/Behavioral:  Positive for depression and substance abuse. Negative for hallucinations, memory loss and suicidal ideas. The patient is nervous/anxious. The patient does not have insomnia.   Blood pressure (!) 147/95, pulse 90, temperature 97.9 F (36.6 C), temperature source Oral, resp. rate 18, height 5\' 3"  (1.6 m), weight 63.5 kg, SpO2 100 %. Body mass index is 24.8 kg/m.   Treatment Plan Summary: Daily contact with patient to assess and evaluate symptoms and progress in treatment and Medication management  04/06/2021 Update: Patient does not appear improved.  Still very depressed.  Unable to engage fully in milieu activities.  Receiving Ativan for anxiety.  Intermittent high blood pressure readings. Continue CIWA protocol.  Alcohol Withdrawal - Continue CIWA protocol:      Ativan 1 to 4 mg  p.o. or I IV every hour as needed and for withdrawal symptoms     Zofran ODT disintegrating tablet 4 mg every 6 hours as needed nausea or              vomiting      Imodium capsule 2 to 4 mg oral as needed diarrhea or loose stools   Supplementation - Continue folic acid 1 mg tablet p.o. daily - Continue multivitamin with minerals 1 tablet p.o. daily -Continue thiamine tablet 100 mg oral or IV daily - Continue hydroxyzine 25 mg oral every 6 hours as needed for anxiety agitation or CIWA less than or equal to 10   Nicotine replacement/smoking cessation - Continue Nicorette gum 2 mg p.o. as needed - Continue NicoDerm CQ patch 14 mg transdermal daily  Hypertension -continue lisinopril tablet 10 mg p.o. daily   Depression/mood - Continue Lexapro 10 mg p.o. daily   PRN, Other --Continue Tylenol 650 mg  po every 6 hrs prn pain --Continue MAALOX/MYLANTA 30 mL po every 4 hrs prn indigestion --Continue Milk of Magnesia 30 mL po daily prn constipation   Vanetta Mulders, NP 04/06/2021, 5:36 PM

## 2021-04-06 NOTE — Plan of Care (Signed)
  Problem: Activity: Goal: Interest or engagement in leisure activities will improve Outcome: Progressing   Problem: Coping: Goal: Coping ability will improve Outcome: Progressing Goal: Will verbalize feelings Outcome: Progressing   Problem: Coping: Goal: Coping ability will improve Outcome: Progressing   Problem: Health Behavior/Discharge Planning: Goal: Identification of resources available to assist in meeting health care needs will improve Outcome: Progressing   Problem: Medication: Goal: Compliance with prescribed medication regimen will improve Outcome: Progressing   Problem: Education: Goal: Knowledge of [place] [facility] Education information/materials will improve Outcome: Progressing Goal: Verbalization of understanding the information provided will improve Outcome: Progressing   Problem: Coping: Goal: Ability to verbalize frustrations and anger appropriately will improve Outcome: Progressing Goal: Ability to demonstrate self-control will improve Outcome: Progressing

## 2021-04-06 NOTE — Progress Notes (Signed)
Patient awoke this morning reporting headache with mild anxiety and agitation. CIWA =7 and 1mg  of Ativan provided to help with symptoms.      Isabella Butler-Nicholson, LPN

## 2021-04-06 NOTE — Progress Notes (Signed)
Patient is better. Showered and active on the unit spending time in the day room. Some of the noise and interactions between other patients on the unit had her feeling more anxious than normal.  Patient also complained of headache rating pain 5/10. Her CIWA score was 11 and 2mg  of Ativan was administered to help with symptoms. She denies si/hi/avh at this encounter. She does endorse anxiety and depression and wants help with both mental and substance  treatment. Encouraged patient to speak with social work regarding treatment. Will continue to monitor with q15 minute safety rounds. Encouraged her  to seek staff with any concerns.   Cleo Butler-Nicholson, LPN

## 2021-04-06 NOTE — Progress Notes (Signed)
Patient in bed and stable. Unlabored breathing and under no distress.. Sleeping soundly. CIWA not conducted.    Cleo Butler-Nicholson,LPN

## 2021-04-06 NOTE — Plan of Care (Signed)
Patient withdrawn to her room.States " I don't feel like talking to anyone." Patient appears depressed with minimal withdrawal symptoms. Denies SI,HI and AVH. Compliant with medications. Appetite good. Energy level fair.Encouraged hydration. Support and encouragement given.

## 2021-04-06 NOTE — [facility] Group Notes (Signed)
LCSW Group Therapy Note  04/06/2021 12:56 PM  Type of Therapy/Topic:  Group Therapy:  Feelings about Diagnosis  Participation Level:  Did Not Attend   Description of Group:   This group will allow patients to explore their thoughts and feelings about diagnoses they have received. Patients will be guided to explore their level of understanding and acceptance of these diagnoses. Facilitator will encourage patients to process their thoughts and feelings about the reactions of others to their diagnosis and will guide patients in identifying ways to discuss their diagnosis with significant others in their lives. This group will be process-oriented, with patients participating in exploration of their own experiences, giving and receiving support, and processing challenge from other group members.   Therapeutic Goals: Patient will demonstrate understanding of diagnosis as evidenced by identifying two or more symptoms of the disorder Patient will be able to express two feelings regarding the diagnosis Patient will demonstrate their ability to communicate their needs through discussion and/or role play  Summary of Patient Progress: X  Therapeutic Modalities:   Cognitive Behavioral Therapy Brief Therapy Feelings Identification   Penni Homans, MSW, LCSW 04/06/2021 12:56 PM

## 2021-04-06 NOTE — Progress Notes (Signed)
Patient asleep. Stable no signs of distress. CIWA not conducted.     Isabella Butler-Nicholson, LPN

## 2021-04-07 NOTE — Progress Notes (Signed)
D: Pt alert and oriented. Pt rates depression 7/10 and anxiety 7/10. Pt denies  experiencing any pain at this time. Pt denies experiencing any SI/HI, or AVH at this time.   A: Scheduled medications administered to pt, per MD orders. Support and encouragement provided. Frequent verbal contact made. Routine safety checks conducted q15 minutes.   R: No adverse drug reactions noted. Pt verbally contracts for safety at this time. Pt complaint with medications and treatment plan. Pt interacts well with others on the unit. Pt remains safe at this time. Will continue to monitor.

## 2021-04-07 NOTE — Progress Notes (Signed)
Recreation Therapy Notes  Date: 04/07/2021  Time: 10:00 am   Location: Craft room    Behavioral response: N/A   Intervention Topic: Happiness   Discussion/Intervention: Patient did not attend group.   Clinical Observations/Feedback:  Patient did not attend group.   Tamar Miano LRT/CTRS        Dann Galicia 04/07/2021 12:23 PM

## 2021-04-07 NOTE — [facility] Group Notes (Signed)
LCSW Group Therapy Note  04/07/2021 3:14 PM  Type of Therapy/Topic:  Group Therapy:  Emotion Regulation  Participation Level:  Did Not Attend   Description of Group:   The purpose of this group is to assist patients in learning to regulate negative emotions and experience positive emotions. Patients will be guided to discuss ways in which they have been vulnerable to their negative emotions. These vulnerabilities will be juxtaposed with experiences of positive emotions or situations, and patients will be challenged to use positive emotions to combat negative ones. Special emphasis will be placed on coping with negative emotions in conflict situations, and patients will process healthy conflict resolution skills.  Therapeutic Goals: Patient will identify two positive emotions or experiences to reflect on in order to balance out negative emotions Patient will label two or more emotions that they find the most difficult to experience Patient will demonstrate positive conflict resolution skills through discussion and/or role plays  Summary of Patient Progress: Patient did not attend group despite encouraged participation.     Therapeutic Modalities:   Cognitive Behavioral Therapy Feelings Identification Dialectical Behavioral Therapy  Gwenevere Ghazi, MSW, [place], [place] 04/07/2021 3:14 PM

## 2021-04-07 NOTE — Progress Notes (Signed)
   04/06/21 1550  Clinical Encounter Type  Visited With Patient not available  Referral From [facility] received a referral from another chaplain that Pt had requested to speak with a chaplain (on 8/1?). Chaplain Burris attended to this request at first opportunity but did not receive any response from Pt when chaplain visited Ms. Codrington's room. Will continue to attempt visitation if pastoral support is desired.

## 2021-04-07 NOTE — Progress Notes (Signed)
Patient alert and oriented x 4 affect is flat thoughts are organized and coherent , she denies SI/HI/AVH she was receptive to staff she was not irritable, she appears less anxious, and was complaint with her medication regimen. Patient was offered emotional support no distress noted, 15 minutes safety checks maintained.

## 2021-04-07 NOTE — Progress Notes (Signed)
[facility]  04/07/2021 10:18 AM Isabella Clements  MRN:  [id number]  CC: " I talked with my mom last night."  Subjective:   Isabella Clements is a 35 year old female who was brought to the ED by police department due to suicidal ideations.  Patient was seen one-to-one and interviewed.  Patient is pleasant and cooperative.  Patient describes a longstanding history of untreated depression, drug use, and alcohol misuse.  Patient states that her father died in a car accident when she was 35 years old and since that time she has used drugs and alcohol to cope.  She denies ever seeing a therapist or being on any psychotropic medications.  She states that she lives with her mother in the house that she shared with both her mother and father.  Mother has a boyfriend who moved in 1 year after patient's father passed.  Patient says she has never gotten along with this man.  This has been a source of contention between her and her mother. Patient was seen and interviewed this morning.  She was in her bed after taking medications and stated she ate some breakfast.  Patient is sleepy, but alert enough for interview.  Patient states that she did speak with her mom last night and she says that her mom told her she is worried about her.  Patient states that she believes her mom and she feels better after talking to her, feels supported by her.  Patient reports that her goal is to "get back to myself."  Patient asked about discharge.  I told patient that she will not be discharged today, she is still withdrawing and we would like to get a good discharge plan together.  Suggested she just use this time to continue recovering and reflect so that she will be ready to embark on long-term recovery.  Patient agrees.  She states that she did talk to Isabella Clements about [facility] services and would like to do that after discharge. Patient has not been interactive in the milieu, mostly isolates to her room.  No unsafe behavior has been  noted.  Patient is taking medication as prescribed.  I called the lab regarding part of CBC results from 04/07/2019 being flagged as "results unavailable due to interfering substance."  Was told by the technician that there is some substance in the sample that is preventing a calculation of those measurements.  Differential was normal.  Morphology of RBCs unremarkable.  Reactive benign lymphocytes present.  Normal platelet morphology.  WBCs still elevated at 11.0, but down from date of 04/04/2021, when they were 17.3.  Hemoglobin on 04/06/2021 was elevated at 15.7, but down from 16.3 on 04/05/2019.  Technician suggested to repeat CBC if those calculations are desired.  I will consult with attending, Dr. Neale BurlyFreeman.   Principal Problem: Major depressive disorder, single episode, severe without psychosis (HCC) Diagnosis: Principal Problem:   Major depressive disorder, single episode, severe without psychosis (HCC) Active Problems:   Alcohol use disorder, moderate, dependence (HCC)   Cocaine abuse (HCC)  Total Time spent with patient: 20 minutes  Past Psychiatric History: Patient describes longstanding history of depression and substance use.  Has had no treatment for either.    Past Medical History: History reviewed. No pertinent past medical history. History reviewed. No pertinent surgical history. Family History: History reviewed. No pertinent family history. Family Psychiatric  History: Denies Social History:  Social History   Substance and Sexual Activity  Alcohol Use Yes   Alcohol/week: 7.0  standard drinks   Types: 7 Cans of beer per week   Comment: pt states, "a beer a day"     Social History   Substance and Sexual Activity  Drug Use No    Social History   Socioeconomic History   Marital status: Single    Spouse name: Not on file   Number of children: Not on file   Years of education: Not on file   Highest education level: Not on file  Occupational History   Not on file   Tobacco Use   Smoking status: Every Day    Packs/day: 0.50    Types: Cigarettes   Smokeless tobacco: Never  Substance and Sexual Activity   Alcohol use: Yes    Alcohol/week: 7.0 standard drinks    Types: 7 Cans of beer per week    Comment: pt states, "a beer a day"   Drug use: No   Sexual activity: Yes    Birth control/protection: None  Other Topics Concern   Not on file  Social History Narrative   Not on file   Social Determinants of Health   Financial Resource Strain: Not on file  Food Insecurity: Not on file  Transportation Needs: Not on file  Physical Activity: Not on file  Stress: Not on file  Social Connections: Not on file   Additional Social History:      Sleep: Good  Appetite:  Poor  Current Medications: Current Facility-Administered Medications  Medication Dose Route Frequency Provider Last Rate Last Admin   acetaminophen (TYLENOL) tablet 650 mg  650 mg Oral Q6H PRN Charm Rings, NP   650 mg at 04/06/21 1621   alum & mag hydroxide-simeth (MAALOX/MYLANTA) 200-200-20 MG/5ML suspension 30 mL  30 mL Oral Q4H PRN Charm Rings, NP       escitalopram (LEXAPRO) tablet 10 mg  10 mg Oral Daily Gabriel Cirri F, NP   10 mg at 04/07/21 6759   folic acid (FOLVITE) tablet 1 mg  1 mg Oral Daily Jesse Sans, MD   1 mg at 04/07/21 1638   hydrOXYzine (ATARAX/VISTARIL) tablet 25 mg  25 mg Oral Q6H PRN Gabriel Cirri F, NP       lisinopril (ZESTRIL) tablet 10 mg  10 mg Oral Daily Jesse Sans, MD   10 mg at 04/07/21 4665   loperamide (IMODIUM) capsule 2-4 mg  2-4 mg Oral PRN Vanetta Mulders, NP       LORazepam (ATIVAN) injection 1-4 mg  1-4 mg Intramuscular Q1H PRN Jesse Sans, MD       Or   LORazepam (ATIVAN) tablet 1-4 mg  1-4 mg Oral Q1H PRN Jesse Sans, MD   2 mg at 04/06/21 2123   magnesium hydroxide (MILK OF MAGNESIA) suspension 30 mL  30 mL Oral Daily PRN Charm Rings, NP       multivitamin with minerals tablet 1 tablet  1 tablet Oral  Daily Jesse Sans, MD   1 tablet at 04/07/21 9935   nicotine (NICODERM CQ - dosed in mg/24 hours) patch 14 mg  14 mg Transdermal Daily Gabriel Cirri F, NP   14 mg at 04/07/21 0816   nicotine polacrilex (NICORETTE) gum 2 mg  2 mg Oral PRN Vanetta Mulders, NP   2 mg at 04/07/21 0829   ondansetron (ZOFRAN-ODT) disintegrating tablet 4 mg  4 mg Oral Q6H PRN Vanetta Mulders, NP       thiamine tablet 100 mg  100 mg Oral Daily Jesse Sans, MD   100 mg at 04/07/21 2841   Or   thiamine (B-1) injection 100 mg  100 mg Intravenous Daily Jesse Sans, MD        Lab Results:  Results for orders placed or performed during the hospital encounter of 04/04/21 (from the past 48 hour(s))  TSH     Status: None   Collection Time: 04/05/21 10:48 AM  Result Value Ref Range   TSH 0.514 0.350 - 4.500 uIU/mL    Comment: Performed by a 3rd Generation assay with a functional sensitivity of <=0.01 uIU/mL. Performed at [facility], [address] [place], [place] [address]   Pregnancy, urine     Status: None   Collection Time: 04/05/21  3:00 PM  Result Value Ref Range   Preg Test, Ur NEGATIVE NEGATIVE    Comment: Performed at [facility], [address] [place], [place] [address]  CBC with Differential/Platelet     Status: Abnormal   Collection Time: 04/06/21  7:29 AM  Result Value Ref Range   WBC 11.0 (H) 4.0 - 10.5 K/uL   RBC RESULTS UNAVAILABLE DUE TO INTERFERING SUBSTANCE 3.87 - 5.11 MIL/uL   Hemoglobin 15.7 (H) 12.0 - 15.0 g/dL   HCT RESULTS UNAVAILABLE DUE TO INTERFERING SUBSTANCE 36.0 - 46.0 %   MCV RESULTS UNAVAILABLE DUE TO INTERFERING SUBSTANCE 80.0 - 100.0 fL   MCH RESULTS UNAVAILABLE DUE TO INTERFERING SUBSTANCE 26.0 - 34.0 pg   MCHC RESULTS UNAVAILABLE DUE TO INTERFERING SUBSTANCE 30.0 - 36.0 g/dL   RDW RESULTS UNAVAILABLE DUE TO INTERFERING SUBSTANCE 11.5 - 15.5 %   Platelets 343 150 - 400 K/uL   nRBC 0.0 0.0 - 0.2 %   Neutrophils Relative % 60 %    Neutro Abs 6.7 1.7 - 7.7 K/uL   Lymphocytes Relative 28 %   Lymphs Abs 3.1 0.7 - 4.0 K/uL   Monocytes Relative 7 %   Monocytes Absolute 0.8 0.1 - 1.0 K/uL   Eosinophils Relative 3 %   Eosinophils Absolute 0.3 0.0 - 0.5 K/uL   Basophils Relative 1 %   Basophils Absolute 0.1 0.0 - 0.1 K/uL   RBC Morphology MORPHOLOGY UNREMARKABLE    Smear Review Normal platelet morphology    Immature Granulocytes 1 %   Abs Immature Granulocytes 0.05 0.00 - 0.07 K/uL   Reactive, Benign Lymphocytes PRESENT     Comment: Performed at [facility], [address] [place], [place] [address]    Blood Alcohol level:  Lab Results  Component Value Date   ETH 53 (H) 04/04/2021    Metabolic Disorder Labs: No results found for: HGBA1C, MPG No results found for: PROLACTIN No results found for: CHOL, TRIG, HDL, CHOLHDL, VLDL, LDLCALC  Physical Findings: AIMS:  , ,  ,  ,    CIWA:  CIWA-Ar Total: 4 COWS:     Musculoskeletal: Strength & Muscle Tone: within normal limits Gait & Station: normal Patient leans: N/A  Psychiatric Specialty Exam:  Presentation  General Appearance: Appropriate for Environment  Eye Contact:Good  Speech:Clear and Coherent; Slow  Speech Volume:Normal  Handedness: No data recorded  Mood and Affect  Mood:Depressed; Anxious  Affect:Depressed; Congruent   Thought Process  Thought Processes:Coherent  Descriptions of Associations:No data recorded Orientation:Full (Time, Place and Person)  Thought Content:WDL  History of Schizophrenia/Schizoaffective disorder:No  Duration of Psychotic Symptoms:No data recorded Hallucinations:No data recorded  Ideas of Reference:None (Denies)  Suicidal Thoughts:Suicidal Thoughts: No (Denies)  Homicidal Thoughts:Homicidal Thoughts: No (Denies)   Sensorium  Memory:Immediate Good; Recent Fair  Judgment:Fair  Insight:Fair   Executive Functions  Concentration:Fair  Attention Span:Good  Recall:Good  Fund of  Knowledge:Good  Language:Good   Psychomotor Activity  Psychomotor Activity:No data recorded   Assets  Assets:Communication Skills; Desire for Improvement; Financial Resources/Insurance; Housing; Social Support; Resilience; Physical Health; Vocational/Educational; Leisure Time   Sleep  Sleep:Number of Hours of Sleep: 7.25    Physical Exam: Physical Exam Vitals and nursing note reviewed.  HENT:     Head: Normocephalic.     Nose: No congestion or rhinorrhea.  Eyes:     General:        Right eye: No discharge.        Left eye: No discharge.  Cardiovascular:     Rate and Rhythm: Normal rate.  Pulmonary:     Effort: Pulmonary effort is normal.  Musculoskeletal:        General: Normal range of motion.     Cervical back: Normal range of motion.  Neurological:     Mental Status: She is alert and oriented to person, place, and time.  Psychiatric:        Mood and Affect: Mood is depressed.        Speech: Speech is delayed.        Behavior: Behavior is cooperative.     Comments: Patient sleepy, still with withdrawal sx    Review of Systems  Psychiatric/Behavioral:  Positive for depression and substance abuse. Negative for hallucinations, memory loss and suicidal ideas. The patient is nervous/anxious. The patient does not have insomnia.   Blood pressure 116/76, pulse 83, temperature 98 F (36.7 C), resp. rate 18, height 5\' 3"  (1.6 m), weight 63.5 kg, SpO2 100 %. Body mass index is 24.8 kg/m.   Treatment Plan Summary: Daily contact with patient to assess and evaluate symptoms and progress in treatment and Medication management  04/07/2021 Update: Patient still not engaging in milieu activities.  CIWA protocol continues.  Patient receiving Ativan for anxiety.  Intermittent high blood pressure readings.  Stable this morning.  No change in medications.   Alcohol Withdrawal - Continue CIWA protocol:      Ativan 1 to 4 mg p.o. or I IV every hour as needed and for withdrawal  symptoms     Zofran ODT disintegrating tablet 4 mg every 6 hours as needed nausea or              vomiting      Imodium capsule 2 to 4 mg oral as needed diarrhea or loose stools   Supplementation - Continue folic acid 1 mg tablet p.o. daily - Continue multivitamin with minerals 1 tablet p.o. daily -Continue thiamine tablet 100 mg oral or IV daily - Continue hydroxyzine 25 mg oral every 6 hours as needed for anxiety agitation or CIWA less than or equal to 10   Nicotine replacement/smoking cessation - Continue Nicorette gum 2 mg p.o. as needed - Continue NicoDerm CQ patch 14 mg transdermal daily  Hypertension -Continue lisinopril tablet 10 mg p.o. daily   Depression/mood - Continue Lexapro 10 mg p.o. daily   PRN, Other --Continue Tylenol 650 mg po every 6 hrs prn pain --Continue MAALOX/MYLANTA 30 mL po every 4 hrs prn indigestion --Continue Milk of Magnesia 30 mL po daily prn constipation   06/07/2021, NP 04/07/2021, 10:18 AM

## 2021-04-08 ENCOUNTER — Other Ambulatory Visit: Payer: Self-pay

## 2021-04-08 DIAGNOSIS — F322 Major depressive disorder, single episode, severe without psychotic features: Secondary | ICD-10-CM | POA: Diagnosis not present

## 2021-04-08 MED ORDER — LISINOPRIL 10 MG PO TABS: 10.0000 mg | ORAL_TABLET | Freq: Every day | ORAL | 0 refills | Status: DC

## 2021-04-08 MED ORDER — LISINOPRIL 10 MG PO TABS
10.0000 mg | ORAL_TABLET | Freq: Every day | ORAL | 0 refills | Status: AC
  Filled 2021-04-08: qty 7, 7d supply, fill #0

## 2021-04-08 MED ORDER — FOLIC ACID 1 MG PO TABS: 1.0000 mg | ORAL_TABLET | Freq: Every day | ORAL | 0 refills | Status: DC

## 2021-04-08 MED ORDER — ESCITALOPRAM OXALATE 10 MG PO TABS
10.0000 mg | ORAL_TABLET | Freq: Every day | ORAL | 0 refills | Status: DC
  Filled 2021-04-08: qty 7, 7d supply, fill #0

## 2021-04-08 MED ORDER — HYDROXYZINE HCL 25 MG PO TABS: 25.0000 mg | ORAL_TABLET | Freq: Three times a day (TID) | ORAL | 0 refills | Status: DC | PRN

## 2021-04-08 MED ORDER — ESCITALOPRAM OXALATE 10 MG PO TABS: 10.0000 mg | ORAL_TABLET | Freq: Every day | ORAL | 1 refills | Status: DC

## 2021-04-08 MED ORDER — HYDROXYZINE HCL 25 MG PO TABS
25.0000 mg | ORAL_TABLET | Freq: Three times a day (TID) | ORAL | 0 refills | Status: DC | PRN
  Filled 2021-04-08: qty 14, 5d supply, fill #0

## 2021-04-08 MED ORDER — FOLIC ACID 1 MG PO TABS
1.0000 mg | ORAL_TABLET | Freq: Every day | ORAL | 0 refills | Status: DC
  Filled 2021-04-08: qty 7, 7d supply, fill #0

## 2021-04-08 MED ORDER — VITAMIN B-1 100 MG PO TABS
100.0000 mg | ORAL_TABLET | Freq: Every day | ORAL | 0 refills | Status: DC
  Filled 2021-04-08: qty 7, 7d supply, fill #0

## 2021-04-08 MED ORDER — HYDROXYZINE HCL 25 MG PO TABS
25.0000 mg | ORAL_TABLET | Freq: Three times a day (TID) | ORAL | Status: DC | PRN
  Administered 2021-04-08: 25 mg via ORAL
  Filled 2021-04-08: qty 1

## 2021-04-08 NOTE — Plan of Care (Addendum)
Patient during assessments this morning endorsed a normal mood and affect was congruent to mood/mildly constricted. Pt. Denied si/hi/avh and endorsed ability to continue to remain safe on the unit. Pt. Orientation appeared grossly intact. Pt. Denied physical pain and endorsed toleration of medications thus far. Pt. Endorsed no problems with sleeping and or eating at this time. Pt. Had no complaints for this Clinical research associate. Pt. Denied signs or symptoms of alcohol withdrawal.    Patient has been complaint with medications and unit procedures thus far. Pt. Has been observed eating good thus far. Pt. Has been able to remain safe on the unit thus far. Pt. Outside of breakfast this morning has been observed resting in her room.   Q x 15 minute observation checks in place/maintained for safety. Patient is provided with education throughout shift when appropriate and able.  Patient is given/offered medications per orders. Patient is encouraged to attend groups, participate in unit activities and continue with plan of care, until pending discharge is complete. Pt. Chart and plans of care reviewed. Pt. Given support and encouragement when appropriate and able. Pt. Given discharge information, as information is being processed.      Problem: Activity: Goal: Interest or engagement in leisure activities will improve Outcome: Adequate for Discharge   Problem: Coping: Goal: Coping ability will improve Outcome: Adequate for Discharge Goal: Will verbalize feelings Outcome: Adequate for Discharge   Problem: Coping: Goal: Coping ability will improve Outcome: Adequate for Discharge   Problem: Health Behavior/Discharge Planning: Goal: Identification of resources available to assist in meeting health care needs will improve Outcome: Adequate for Discharge   Problem: Medication: Goal: Compliance with prescribed medication regimen will improve Outcome: Adequate for Discharge   Problem: Education: Goal: Knowledge of  [place] [facility] Education information/materials will improve Outcome: Adequate for Discharge Goal: Verbalization of understanding the information provided will improve Outcome: Adequate for Discharge   Problem: Coping: Goal: Ability to verbalize frustrations and anger appropriately will improve Outcome: Adequate for Discharge Goal: Ability to demonstrate self-control will improve Outcome: Adequate for Discharge

## 2021-04-08 NOTE — Plan of Care (Signed)
  Problem: Activity: Goal: Interest or engagement in leisure activities will improve 04/08/2021 1029 by Lenox Ponds, RN Outcome: Adequate for Discharge 04/08/2021 1029 by Lenox Ponds, RN Outcome: Adequate for Discharge 04/08/2021 1028 by Lenox Ponds, RN Outcome: Adequate for Discharge 04/08/2021 1009 by Lenox Ponds, RN Outcome: Adequate for Discharge   Problem: Coping: Goal: Coping ability will improve 04/08/2021 1029 by Lenox Ponds, RN Outcome: Adequate for Discharge 04/08/2021 1029 by Lenox Ponds, RN Outcome: Adequate for Discharge 04/08/2021 1028 by Lenox Ponds, RN Outcome: Adequate for Discharge 04/08/2021 1009 by Lenox Ponds, RN Outcome: Adequate for Discharge Goal: Will verbalize feelings 04/08/2021 1029 by Lenox Ponds, RN Outcome: Adequate for Discharge 04/08/2021 1029 by Lenox Ponds, RN Outcome: Adequate for Discharge 04/08/2021 1028 by Lenox Ponds, RN Outcome: Adequate for Discharge 04/08/2021 1009 by Lenox Ponds, RN Outcome: Adequate for Discharge   Problem: Coping: Goal: Will verbalize feelings 04/08/2021 1029 by Lenox Ponds, RN Outcome: Adequate for Discharge 04/08/2021 1029 by Lenox Ponds, RN Outcome: Adequate for Discharge 04/08/2021 1028 by Lenox Ponds, RN Outcome: Adequate for Discharge 04/08/2021 1009 by Lenox Ponds, RN Outcome: Adequate for Discharge   Problem: Health Behavior/Discharge Planning: Goal: Identification of resources available to assist in meeting health care needs will improve 04/08/2021 1029 by Lenox Ponds, RN Outcome: Adequate for Discharge 04/08/2021 1029 by Lenox Ponds, RN Outcome: Adequate for Discharge 04/08/2021 1028 by Lenox Ponds, RN Outcome: Adequate for Discharge 04/08/2021 1009 by Lenox Ponds, RN Outcome: Adequate for Discharge   Problem: Medication: Goal: Compliance with prescribed medication regimen will improve 04/08/2021 1029 by Lenox Ponds,  RN Outcome: Adequate for Discharge 04/08/2021 1029 by Lenox Ponds, RN Outcome: Adequate for Discharge 04/08/2021 1028 by Lenox Ponds, RN Outcome: Adequate for Discharge 04/08/2021 1009 by Lenox Ponds, RN Outcome: Adequate for Discharge   Problem: Education: Goal: Knowledge of [place] [facility] Education information/materials will improve 04/08/2021 1029 by Lenox Ponds, RN Outcome: Adequate for Discharge 04/08/2021 1029 by Lenox Ponds, RN Outcome: Adequate for Discharge 04/08/2021 1028 by Lenox Ponds, RN Outcome: Adequate for Discharge 04/08/2021 1009 by Lenox Ponds, RN Outcome: Adequate for Discharge Goal: Verbalization of understanding the information provided will improve 04/08/2021 1029 by Lenox Ponds, RN Outcome: Adequate for Discharge 04/08/2021 1029 by Lenox Ponds, RN Outcome: Adequate for Discharge 04/08/2021 1028 by Lenox Ponds, RN Outcome: Adequate for Discharge 04/08/2021 1009 by Lenox Ponds, RN Outcome: Adequate for Discharge   Problem: Coping: Goal: Ability to verbalize frustrations and anger appropriately will improve 04/08/2021 1029 by Lenox Ponds, RN Outcome: Adequate for Discharge 04/08/2021 1029 by Lenox Ponds, RN Outcome: Adequate for Discharge 04/08/2021 1028 by Lenox Ponds, RN Outcome: Adequate for Discharge 04/08/2021 1009 by Lenox Ponds, RN Outcome: Adequate for Discharge Goal: Ability to demonstrate self-control will improve 04/08/2021 1029 by Lenox Ponds, RN Outcome: Adequate for Discharge 04/08/2021 1029 by Lenox Ponds, RN Outcome: Adequate for Discharge 04/08/2021 1028 by Lenox Ponds, RN Outcome: Adequate for Discharge 04/08/2021 1009 by Lenox Ponds, RN Outcome: Adequate for Discharge

## 2021-04-08 NOTE — Progress Notes (Signed)
Recreation Therapy Notes  INPATIENT RECREATION TR PLAN  Patient Details Name: WYNELLE DREIER MRN: [id number] DOB: 1985/10/26 Today's Date: 04/08/2021  Rec Therapy Plan Is patient appropriate for Therapeutic Recreation?: Yes Treatment times per week: at least 3 Estimated Length of Stay: 5-7 days TR Treatment/Interventions: Group participation (Comment)  Discharge Criteria Pt will be discharged from therapy if:: Discharged Treatment plan/goals/alternatives discussed and agreed upon by:: Patient/family  Discharge Summary Short term goals set: Patient will engage in groups without prompting or encouragement from LRT x3 group sessions within 5 recreation therapy group sessions Short term goals met: Not met Reason goals not met: Patient did not attend any groups Therapeutic equipment acquired: N/A Reason patient discharged from therapy: Discharge from hospital Pt/family agrees with progress & goals achieved: Yes Date patient discharged from therapy: 04/08/21   Alika Saladin 04/08/2021, 1:42 PM

## 2021-04-08 NOTE — Plan of Care (Signed)
  Problem: Activity: Goal: Interest or engagement in leisure activities will improve 04/08/2021 1030 by Reyes Ivan, RN Outcome: Completed/Met 04/08/2021 1029 by Reyes Ivan, RN Outcome: Adequate for Discharge 04/08/2021 1029 by Reyes Ivan, RN Outcome: Adequate for Discharge 04/08/2021 1028 by Reyes Ivan, RN Outcome: Adequate for Discharge 04/08/2021 1009 by Reyes Ivan, RN Outcome: Adequate for Discharge   Problem: Coping: Goal: Coping ability will improve 04/08/2021 1030 by Reyes Ivan, RN Outcome: Completed/Met 04/08/2021 1029 by Reyes Ivan, RN Outcome: Adequate for Discharge 04/08/2021 1029 by Reyes Ivan, RN Outcome: Adequate for Discharge 04/08/2021 1028 by Reyes Ivan, RN Outcome: Adequate for Discharge 04/08/2021 1009 by Reyes Ivan, RN Outcome: Adequate for Discharge Goal: Will verbalize feelings 04/08/2021 1030 by Reyes Ivan, RN Outcome: Completed/Met 04/08/2021 1029 by Reyes Ivan, RN Outcome: Adequate for Discharge 04/08/2021 1029 by Reyes Ivan, RN Outcome: Adequate for Discharge 04/08/2021 1028 by Reyes Ivan, RN Outcome: Adequate for Discharge 04/08/2021 1009 by Reyes Ivan, RN Outcome: Adequate for Discharge   Problem: Coping: Goal: Coping ability will improve 04/08/2021 1030 by Reyes Ivan, RN Outcome: Completed/Met 04/08/2021 1029 by Reyes Ivan, RN Outcome: Adequate for Discharge 04/08/2021 1029 by Reyes Ivan, RN Outcome: Adequate for Discharge 04/08/2021 1028 by Reyes Ivan, RN Outcome: Adequate for Discharge 04/08/2021 1009 by Reyes Ivan, RN Outcome: Adequate for Discharge   Problem: Health Behavior/Discharge Planning: Goal: Identification of resources available to assist in meeting health care needs will improve 04/08/2021 1030 by Reyes Ivan, RN Outcome: Completed/Met 04/08/2021 1029 by Reyes Ivan, RN Outcome: Adequate for Discharge 04/08/2021 1029 by Reyes Ivan,  RN Outcome: Adequate for Discharge 04/08/2021 1028 by Reyes Ivan, RN Outcome: Adequate for Discharge 04/08/2021 1009 by Reyes Ivan, RN Outcome: Adequate for Discharge   Problem: Education: Goal: Knowledge of [place] Education information/materials will improve 04/08/2021 1030 by Reyes Ivan, RN Outcome: Completed/Met 04/08/2021 1029 by Reyes Ivan, RN Outcome: Adequate for Discharge 04/08/2021 1029 by Reyes Ivan, RN Outcome: Adequate for Discharge 04/08/2021 1028 by Reyes Ivan, RN Outcome: Adequate for Discharge 04/08/2021 1009 by Reyes Ivan, RN Outcome: Adequate for Discharge Goal: Verbalization of understanding the information provided will improve 04/08/2021 1030 by Reyes Ivan, RN Outcome: Completed/Met 04/08/2021 1029 by Reyes Ivan, RN Outcome: Adequate for Discharge 04/08/2021 1029 by Reyes Ivan, RN Outcome: Adequate for Discharge 04/08/2021 1028 by Reyes Ivan, RN Outcome: Adequate for Discharge 04/08/2021 1009 by Reyes Ivan, RN Outcome: Adequate for Discharge   Problem: Coping: Goal: Ability to verbalize frustrations and anger appropriately will improve 04/08/2021 1030 by Reyes Ivan, RN Outcome: Completed/Met 04/08/2021 1029 by Reyes Ivan, RN Outcome: Adequate for Discharge 04/08/2021 1029 by Reyes Ivan, RN Outcome: Adequate for Discharge 04/08/2021 1028 by Reyes Ivan, RN Outcome: Adequate for Discharge 04/08/2021 1009 by Reyes Ivan, RN Outcome: Adequate for Discharge Goal: Ability to demonstrate self-control will improve 04/08/2021 1030 by Reyes Ivan, RN Outcome: Completed/Met 04/08/2021 1029 by Reyes Ivan, RN Outcome: Adequate for Discharge 04/08/2021 1029 by Reyes Ivan, RN Outcome: Adequate for Discharge 04/08/2021 1028 by Reyes Ivan, RN Outcome: Adequate for Discharge 04/08/2021 1009 by Reyes Ivan, RN Outcome: Adequate for Discharge

## 2021-04-08 NOTE — Plan of Care (Signed)
  Problem: Activity: Goal: Interest or engagement in leisure activities will improve 04/08/2021 1028 by Lenox Ponds, RN Outcome: Adequate for Discharge 04/08/2021 1009 by Lenox Ponds, RN Outcome: Adequate for Discharge   Problem: Coping: Goal: Coping ability will improve 04/08/2021 1028 by Lenox Ponds, RN Outcome: Adequate for Discharge 04/08/2021 1009 by Lenox Ponds, RN Outcome: Adequate for Discharge Goal: Will verbalize feelings 04/08/2021 1028 by Lenox Ponds, RN Outcome: Adequate for Discharge 04/08/2021 1009 by Lenox Ponds, RN Outcome: Adequate for Discharge   Problem: Coping: Goal: Coping ability will improve 04/08/2021 1028 by Lenox Ponds, RN Outcome: Adequate for Discharge 04/08/2021 1009 by Lenox Ponds, RN Outcome: Adequate for Discharge   Problem: Health Behavior/Discharge Planning: Goal: Identification of resources available to assist in meeting health care needs will improve 04/08/2021 1028 by Lenox Ponds, RN Outcome: Adequate for Discharge 04/08/2021 1009 by Lenox Ponds, RN Outcome: Adequate for Discharge   Problem: Medication: Goal: Compliance with prescribed medication regimen will improve 04/08/2021 1028 by Lenox Ponds, RN Outcome: Adequate for Discharge 04/08/2021 1009 by Lenox Ponds, RN Outcome: Adequate for Discharge   Problem: Education: Goal: Knowledge of [place] [facility] Education information/materials will improve 04/08/2021 1028 by Lenox Ponds, RN Outcome: Adequate for Discharge 04/08/2021 1009 by Lenox Ponds, RN Outcome: Adequate for Discharge Goal: Verbalization of understanding the information provided will improve 04/08/2021 1028 by Lenox Ponds, RN Outcome: Adequate for Discharge 04/08/2021 1009 by Lenox Ponds, RN Outcome: Adequate for Discharge

## 2021-04-08 NOTE — Progress Notes (Signed)
  [facility] Adult Case Management Discharge Plan :  Will you be returning to the same living situation after discharge:  Yes,  Patient to return to Mother's home.  At discharge, do you have transportation home?: Yes,  CSW to assist with transportation.  Do you have the ability to pay for your medications: No.  Release of information consent forms completed and in the chart;  Patient's signature needed at discharge.  Patient to Follow up at:  Follow-up Information     [facility] Go on 04/12/2021.   Why: Please present for scheduled appointment on Monday 8/8 @ 7:15AM. Contact information: [address] [facility] [address] [phone]                 Next level of care provider has access to [facility] Link:no  Safety Planning and Suicide Prevention discussed: Yes,  SPE completed with patient. Patient declined consent for CSW to reach collateral.      Has patient been referred to the Quitline?: Patient refused referral  Patient has been referred for addiction treatment: Yes  Corky Crafts, LCSWA 04/08/2021, 9:36 AM

## 2021-04-08 NOTE — Progress Notes (Signed)
Recreation Therapy Notes   Date: 04/08/2021  Time: 10:00 am   Location: Courtyard    Behavioral response: N/A   Intervention Topic: Leisure   Discussion/Intervention: Patient did not attend group.   Clinical Observations/Feedback:  Patient did not attend group.   Arihana Ambrocio LRT/CTRS        Trelyn Vanderlinde 04/08/2021 11:20 AM

## 2021-04-08 NOTE — Discharge Summary (Signed)
Physician Discharge Summary Note  Patient:  Isabella Clements is an 35 y.o., female MRN:  [id number] DOB:  November 26, 1985 Patient phone:  [phone] (home)  Patient address:   [address] [place] [address],  Total Time spent with patient: 30 minutes  Date of Admission:  04/04/2021 Date of Discharge: 04/08/2021  Reason for Admission:    Principal Problem: Major depressive disorder, single episode, severe without psychosis (HCC) Discharge Diagnoses: Principal Problem:   Major depressive disorder, single episode, severe without psychosis (HCC) Active Problems:   Alcohol use disorder, moderate, dependence (HCC)   Cocaine abuse (HCC)   Past Psychiatric History:  Patient describes longstanding history of depression and substance use.  Has had no treatment for either.  Past Medical History: History reviewed. No pertinent past medical history. History reviewed. No pertinent surgical history. Family History: History reviewed. No pertinent family history. Family Psychiatric  History: Denies Social History:  Social History   Substance and Sexual Activity  Alcohol Use Yes   Alcohol/week: 7.0 standard drinks   Types: 7 Cans of beer per week   Comment: pt states, "a beer a day"     Social History   Substance and Sexual Activity  Drug Use No    Social History   Socioeconomic History   Marital status: Single    Spouse name: Not on file   Number of children: Not on file   Years of education: Not on file   Highest education level: Not on file  Occupational History   Not on file  Tobacco Use   Smoking status: Every Day    Packs/day: 0.50    Types: Cigarettes   Smokeless tobacco: Never  Substance and Sexual Activity   Alcohol use: Yes    Alcohol/week: 7.0 standard drinks    Types: 7 Cans of beer per week    Comment: pt states, "a beer a day"   Drug use: No   Sexual activity: Yes    Birth control/protection: None  Other Topics Concern   Not on file  Social History  Narrative   Not on file   Social Determinants of Health   Financial Resource Strain: Not on file  Food Insecurity: Not on file  Transportation Needs: Not on file  Physical Activity: Not on file  Stress: Not on file  Social Connections: Not on file    Hospital Course:  After the above admission evaluation, patient was admitted to the BMU for medication management and treatment for her symptoms of anxiety, depression and suicidal ideation.  Per patient, this was her first psychiatric hospitalization.  Patient's urine drug screen was positive for cannabinoids and and cocaine.  Patient was placed on CIWA protocol for alcohol withdrawal and received several doses of Ativan during the first 3 days of her stay.. She had several hypertensive readings and was placed on lisinopril 10 mg daily, which controlled this.  Hydroxyzine 25 mg as needed for anxiety 3 times a day was initiated, along with nicotine replacement.  For depression and anxiety, Lexapro was initiated, 10 mg daily.  There were no instances of behavior that required restraints or immediate intervention. Patient remained safe on the unit. There were no instances of self-harming behavior. Patient remained cooperative, but mostly isolating to her room.  She was discharged with appointment at [facility] on August 8 to begin substance misuse treatment.  Patient was agreeable to this and was discharged in stable condition.  Over the course of her hospitalization, Patient states that her symptoms  of anxiety and depression have improved. She denied thoughts of self-harm and suicidal ideation. She expressed the desire to engage in substance misuse treatment. Patient expressed readiness for discharge and future-oriented thinking. Patient encouraged to keep all psychiatric appointments, stay on her medications, and continue with follow-up.      Physical Findings: AIMS:  , ,  ,  ,    CIWA:  CIWA-Ar Total: 0 COWS:     Musculoskeletal: Strength & Muscle Tone:  within normal limits Gait & Station: normal Patient leans: N/A   Psychiatric Specialty Exam:  SEE MD'S DISCHARGE SRA  Presentation  General Appearance: Appropriate for Environment; Casual  Eye Contact:Good  Speech:Clear and Coherent  Speech Volume:Normal  Handedness:Right   Mood and Affect  Mood:Euthymic  Affect:Congruent   Thought Process  Thought Processes:Goal Directed  Descriptions of Associations:Intact  Orientation:Full (Time, Place and Person)  Thought Content:Logical  History of Schizophrenia/Schizoaffective disorder:No  Duration of Psychotic Symptoms:No data recorded Hallucinations:Hallucinations: None  Ideas of Reference:None  Suicidal Thoughts:Suicidal Thoughts: No  Homicidal Thoughts:Homicidal Thoughts: No   Sensorium  Memory:Immediate Good; Recent Fair; Remote Fair  Judgment:Intact  Insight:Present   Executive Functions  Concentration:Fair  Attention Span:Fair  Recall:Fair  Fund of Knowledge:Fair  Language:Fair   Psychomotor Activity  Psychomotor Activity:Psychomotor Activity: Normal   Assets  Assets:Communication Skills; Desire for Improvement; Financial Resources/Insurance; Housing; Physical Health; Social Support; Talents/Skills   Sleep  Sleep:Sleep: Good Number of Hours of Sleep: 7.5    Physical Exam: Physical Exam Vitals (in no acute distress) and nursing note reviewed.  HENT:     Head: Normocephalic.     Nose: No congestion or rhinorrhea.  Eyes:     General:        Right eye: No discharge.        Left eye: No discharge.  Cardiovascular:     Rate and Rhythm: Normal rate.  Pulmonary:     Effort: Pulmonary effort is normal.  Musculoskeletal:        General: Normal range of motion.     Cervical back: Normal range of motion.  Neurological:     Mental Status: She is alert and oriented to person, place, and time.  Psychiatric:        Mood and Affect: Mood normal.   Review of Systems   Psychiatric/Behavioral:  Positive for depression (stable for discahrge) and substance abuse (hx of). Negative for hallucinations, memory loss and suicidal ideas. The patient is nervous/anxious (Stable for discharge). The patient does not have insomnia.   All other systems reviewed and are negative. Blood pressure 132/90, pulse 69, temperature 98.5 F (36.9 C), temperature source Oral, resp. rate 18, height 5\' 3"  (1.6 m), weight 63.5 kg, SpO2 100 %. Body mass index is 24.8 kg/m.   Social History   Tobacco Use  Smoking Status Every Day   Packs/day: 0.50   Types: Cigarettes  Smokeless Tobacco Never   Tobacco Cessation:  Prescription not provided because: FDA approved patches available without a prescription   Blood Alcohol level:  Lab Results  Component Value Date   ETH 53 (H) 04/04/2021    Metabolic Disorder Labs:  No results found for: HGBA1C, MPG No results found for: PROLACTIN No results found for: CHOL, TRIG, HDL, CHOLHDL, VLDL, LDLCALC  See Psychiatric Specialty Exam and Suicide Risk Assessment completed by Attending Physician prior to discharge.  Discharge destination:  Home  Is patient on multiple antipsychotic therapies at discharge:  No   Has Patient had three or more failed  trials of antipsychotic monotherapy by history:  No  Recommended Plan for Multiple Antipsychotic Therapies: NA  Discharge Instructions     Diet - low sodium heart healthy   Complete by: As directed    Discharge instructions   Complete by: As directed    Discharge Recommendations:  Please take  your discharge medications as ordered. It is very important to follow up with your outpatient medical provider to evaluate the need to continue the blood pressure medication that was started. Follow up with your appointment at [facility] on Monday, August 8 for substance use treatment and therapy.    Patient will benefit from monitoring of suicidal thoughts.  Abstain from all illicit substances and  alcohol. If your symptoms worsen or do not continue to improve or if you become actively suicidal or homicidal then it is recommended that you return to the closest hospital emergency room or call 911 for further evaluation and treatment. National Suicide Prevention Lifeline 1800-SUICIDE or [phone]. Please follow up with your primary medical doctor for all other medical needs.  The patient has been educated on the possible side effects to medications and she is to contact a medical professional and inform outpatient provider of any new side effects of medication.   Increase activity slowly   Complete by: As directed       Allergies as of 04/08/2021   No Known Allergies      Medication List     TAKE these medications      Indication  escitalopram 10 MG tablet Commonly known as: LEXAPRO Take 1 tablet (10 mg total) by mouth daily. Start taking on: April 09, 2021  Indication: Major Depressive Disorder   folic acid 1 MG tablet Commonly known as: FOLVITE Take 1 tablet (1 mg total) by mouth daily. Start taking on: April 09, 2021  Indication: supplementation   hydrOXYzine 25 MG tablet Commonly known as: ATARAX/VISTARIL Take 1 tablet (25 mg total) by mouth 3 (three) times daily as needed for anxiety.  Indication: Feeling Anxious   lisinopril 10 MG tablet Commonly known as: ZESTRIL Take 1 tablet (10 mg total) by mouth daily. Start taking on: April 09, 2021  Indication: High Blood Pressure Disorder   thiamine 100 MG tablet Commonly known as: Vitamin B-1 Take 1 tablet (100 mg total) by mouth daily. Start taking on: April 09, 2021  Indication: supplementation        Follow-up Information     [facility] Go on 04/12/2021.   Why: Please present for scheduled appointment on Monday 8/8 @ 7:15AM. Contact information: [address] [place] [address] [phone]                 Follow-up recommendations:  Follow up with outpatient  provider for all your medical care needs. Activity as tolerated. Diet as recommended by your outpatient provider.   Comments:  Take all of your medications as prescribed.  Report any side effects to your outpatient provider promptly.  Refrain from alcohol and illegal drug use while taking medications.  In the case of emergency call 911 or go to the nearest emergency department for evaluation/treatment.   Signed: Vanetta Mulders, NP 04/08/2021, 9:28 AM

## 2021-04-08 NOTE — [facility] Suicide Risk Assessment (Signed)
[facility] Discharge Suicide Risk Assessment   Principal Problem: Major depressive disorder, single episode, severe without psychosis (HCC) Discharge Diagnoses: Principal Problem:   Major depressive disorder, single episode, severe without psychosis (HCC) Active Problems:   Alcohol use disorder, moderate, dependence (HCC)   Cocaine abuse (HCC)   Total Time spent with patient: 35 minutes- 25 minutes face-to-face contact with patient, 10 minutes documentation, coordination of care, scripts   Musculoskeletal: Strength & Muscle Tone: within normal limits Gait & Station: normal Patient leans: N/A  Psychiatric Specialty Exam  Presentation  General Appearance: Appropriate for Environment; Casual  Eye Contact:Good  Speech:Clear and Coherent  Speech Volume:Normal  Handedness:Right   Mood and Affect  Mood:Euthymic  Duration of Depression Symptoms: Greater than two weeks  Affect:Congruent   Thought Process  Thought Processes:Goal Directed  Descriptions of Associations:Intact  Orientation:Full (Time, Place and Person)  Thought Content:Logical  History of Schizophrenia/Schizoaffective disorder:No  Duration of Psychotic Symptoms:No data recorded Hallucinations:Hallucinations: None  Ideas of Reference:None  Suicidal Thoughts:Suicidal Thoughts: No  Homicidal Thoughts:Homicidal Thoughts: No   Sensorium  Memory:Immediate Good; Recent Fair; Remote Fair  Judgment:Intact  Insight:Present   Executive Functions  Concentration:Fair  Attention Span:Fair  Recall:Fair  Fund of Knowledge:Fair  Language:Fair   Psychomotor Activity  Psychomotor Activity:Psychomotor Activity: Normal   Assets  Assets:Communication Skills; Desire for Improvement; Financial Resources/Insurance; Housing; Physical Health; Social Support; Talents/Skills   Sleep  Sleep:Sleep: Good Number of Hours of Sleep: 7.5   Physical Exam: Physical Exam ROS Blood pressure 132/90, pulse 69,  temperature 98.5 F (36.9 C), temperature source Oral, resp. rate 18, height 5\' 3"  (1.6 m), weight 63.5 kg, SpO2 100 %. Body mass index is 24.8 kg/m.  Mental Status Per Nursing Assessment::   On Admission:  Suicidal ideation indicated by patient, Suicidal ideation indicated by others  Demographic Factors:  Unemployed  Loss Factors: NA  Historical Factors: Impulsivity  Risk Reduction Factors:   Sense of responsibility to family, Living with another person, especially a relative, Positive social support, Positive therapeutic relationship, and Positive coping skills or problem solving skills  Continued Clinical Symptoms:  Depression:   Recent sense of peace/wellbeing Alcohol/Substance Abuse/Dependencies  Cognitive Features That Contribute To Risk:  None    Suicide Risk:  Minimal: No identifiable suicidal ideation.  Patients presenting with no risk factors but with morbid ruminations; may be classified as minimal risk based on the severity of the depressive symptoms    Plan Of Care/Follow-up recommendations:  Activity:  as tolerated Diet:  low sodium heart healthy diet  [phone], MD 04/08/2021, 9:16 AM

## 2021-04-08 NOTE — Progress Notes (Signed)
Patient denies SI/HI/AVH, able to contract for safety at this time. Pt appears calm and cooperative, and no distress noted.   All Personal items in locker returned to pt. Upon discharge. Pt. Given applicable medicines.    Pt States she will comply with discharge planning put into place and take MEDS as prescribed. Pt escorted out of the building by this writer/staff.

## 2021-04-08 NOTE — Plan of Care (Signed)
  Problem: Group Participation Goal: STG - Patient will engage in groups without prompting or encouragement from LRT x3 group sessions within 5 recreation therapy group sessions Description: STG - Patient will engage in groups without prompting or encouragement from LRT x3 group sessions within 5 recreation therapy group sessions 04/08/2021 1341 by Ernest Haber, LRT Outcome: Not Applicable 0/05/1979 2217 by Ernest Haber, LRT Outcome: Not Met (add Reason) Note: Patient did not attend any groups.

## 2021-04-08 NOTE — Progress Notes (Signed)
Patient alert and oriented x 4 affect is flats but she brightens upon approach, her thoughts are organized and coherent, she denies SI/HI/AVH . Parent is receptive to staff she was not irritable, she appears less anxious, interacting appropriately with peers and staff. Patient was complaint with her medication regimen, she was offered emotional support no distress noted, 15 minutes safety checks maintained will continue to monitor.

## 2023-03-02 ENCOUNTER — Emergency Department: Payer: Self-pay

## 2023-03-02 ENCOUNTER — Inpatient Hospital Stay
Admission: AD | Admit: 2023-03-02 | Discharge: 2023-03-07 | DRG: 885 | Disposition: A | Discharge status: Home / Self Care | Payer: 59 | Source: Intra-hospital | Attending: Psychiatry | Admitting: Psychiatry

## 2023-03-02 ENCOUNTER — Encounter: Payer: Self-pay | Admitting: Psychiatry

## 2023-03-02 ENCOUNTER — Other Ambulatory Visit: Payer: Self-pay

## 2023-03-02 ENCOUNTER — Emergency Department
Admission: EM | Admit: 2023-03-02 | Discharge: 2023-03-02 | Disposition: A | Discharge status: Other Acute Inpatient Hospital | Payer: Self-pay | Attending: Emergency Medicine | Admitting: Emergency Medicine

## 2023-03-02 DIAGNOSIS — F1410 Cocaine abuse, uncomplicated: Secondary | ICD-10-CM | POA: Diagnosis present

## 2023-03-02 DIAGNOSIS — F322 Major depressive disorder, single episode, severe without psychotic features: Secondary | ICD-10-CM | POA: Diagnosis present

## 2023-03-02 DIAGNOSIS — E876 Hypokalemia: Secondary | ICD-10-CM | POA: Insufficient documentation

## 2023-03-02 DIAGNOSIS — Z79899 Other long term (current) drug therapy: Secondary | ICD-10-CM

## 2023-03-02 DIAGNOSIS — F32A Depression, unspecified: Secondary | ICD-10-CM

## 2023-03-02 DIAGNOSIS — F17210 Nicotine dependence, cigarettes, uncomplicated: Secondary | ICD-10-CM | POA: Diagnosis present

## 2023-03-02 DIAGNOSIS — R45851 Suicidal ideations: Secondary | ICD-10-CM | POA: Diagnosis present

## 2023-03-02 DIAGNOSIS — Z9151 Personal history of suicidal behavior: Secondary | ICD-10-CM | POA: Diagnosis not present

## 2023-03-02 DIAGNOSIS — N179 Acute kidney failure, unspecified: Secondary | ICD-10-CM | POA: Insufficient documentation

## 2023-03-02 DIAGNOSIS — F332 Major depressive disorder, recurrent severe without psychotic features: Secondary | ICD-10-CM | POA: Insufficient documentation

## 2023-03-02 DIAGNOSIS — F1020 Alcohol dependence, uncomplicated: Secondary | ICD-10-CM | POA: Insufficient documentation

## 2023-03-02 LAB — COMPREHENSIVE METABOLIC PANEL
ALT: 23 U/L (ref 0–44)
AST: 24 U/L (ref 15–41)
Albumin: 5.1 g/dL — ABNORMAL HIGH (ref 3.5–5.0)
Alkaline Phosphatase: 70 U/L (ref 38–126)
Anion gap: 12 (ref 5–15)
BUN: 18 mg/dL (ref 6–20)
CO2: 20 mmol/L — ABNORMAL LOW (ref 22–32)
Calcium: 9 mg/dL (ref 8.9–10.3)
Chloride: 101 mmol/L (ref 98–111)
Creatinine, Ser: 1.44 mg/dL — ABNORMAL HIGH (ref 0.44–1.00)
GFR, Estimated: 49 mL/min — ABNORMAL LOW (ref >60)
Glucose, Bld: 132 mg/dL — ABNORMAL HIGH (ref 70–99)
Potassium: 3.1 mmol/L — ABNORMAL LOW (ref 3.5–5.1)
Sodium: 133 mmol/L — ABNORMAL LOW (ref 135–145)
Total Bilirubin: 0.9 mg/dL (ref 0.3–1.2)
Total Protein: 8.3 g/dL — ABNORMAL HIGH (ref 6.5–8.1)

## 2023-03-02 LAB — CBC WITH DIFFERENTIAL/PLATELET
Abs Immature Granulocytes: 0.12 10*3/uL — ABNORMAL HIGH (ref 0.00–0.07)
Basophils Absolute: 0.2 10*3/uL — ABNORMAL HIGH (ref 0.0–0.1)
Basophils Relative: 1 %
Eosinophils Absolute: 0.6 10*3/uL — ABNORMAL HIGH (ref 0.0–0.5)
Eosinophils Relative: 3 %
HCT: 44.6 % (ref 36.0–46.0)
Hemoglobin: 16.4 g/dL — ABNORMAL HIGH (ref 12.0–15.0)
Immature Granulocytes: 1 %
Lymphocytes Relative: 22 %
Lymphs Abs: 4.9 10*3/uL — ABNORMAL HIGH (ref 0.7–4.0)
MCH: 30.1 pg (ref 26.0–34.0)
MCHC: 36.8 g/dL — ABNORMAL HIGH (ref 30.0–36.0)
MCV: 81.8 fL (ref 80.0–100.0)
Monocytes Absolute: 1.2 10*3/uL — ABNORMAL HIGH (ref 0.1–1.0)
Monocytes Relative: 5 %
Neutro Abs: 15.3 10*3/uL — ABNORMAL HIGH (ref 1.7–7.7)
Neutrophils Relative %: 68 %
Platelets: 456 10*3/uL — ABNORMAL HIGH (ref 150–400)
RBC: 5.45 MIL/uL — ABNORMAL HIGH (ref 3.87–5.11)
RDW: 12.7 % (ref 11.5–15.5)
WBC: 22.3 10*3/uL — ABNORMAL HIGH (ref 4.0–10.5)
nRBC: 0 % (ref 0.0–0.2)

## 2023-03-02 LAB — URINE DRUG SCREEN, QUALITATIVE (ARMC ONLY)
Amphetamines, Ur Screen: NOT DETECTED
Barbiturates, Ur Screen: NOT DETECTED
Benzodiazepine, Ur Scrn: NOT DETECTED
Cannabinoid 50 Ng, Ur ~~LOC~~: POSITIVE — AB
Cocaine Metabolite,Ur ~~LOC~~: POSITIVE — AB
MDMA (Ecstasy)Ur Screen: NOT DETECTED
Methadone Scn, Ur: NOT DETECTED
Opiate, Ur Screen: NOT DETECTED
Phencyclidine (PCP) Ur S: NOT DETECTED
Tricyclic, Ur Screen: NOT DETECTED

## 2023-03-02 LAB — CBC
HCT: 44.7 % (ref 36.0–46.0)
Hemoglobin: 16.4 g/dL — ABNORMAL HIGH (ref 12.0–15.0)
MCH: 30.1 pg (ref 26.0–34.0)
MCHC: 36.7 g/dL — ABNORMAL HIGH (ref 30.0–36.0)
MCV: 82 fL (ref 80.0–100.0)
Platelets: 447 10*3/uL — ABNORMAL HIGH (ref 150–400)
RBC: 5.45 MIL/uL — ABNORMAL HIGH (ref 3.87–5.11)
RDW: 12.7 % (ref 11.5–15.5)
WBC: 22.3 10*3/uL — ABNORMAL HIGH (ref 4.0–10.5)
nRBC: 0 % (ref 0.0–0.2)

## 2023-03-02 LAB — URINALYSIS, ROUTINE W REFLEX MICROSCOPIC
Bacteria, UA: NONE SEEN
Bilirubin Urine: NEGATIVE
Glucose, UA: NEGATIVE mg/dL
Ketones, ur: NEGATIVE mg/dL
Leukocytes,Ua: NEGATIVE
Nitrite: NEGATIVE
Protein, ur: NEGATIVE mg/dL
Specific Gravity, Urine: 1.002 — ABNORMAL LOW (ref 1.005–1.030)
Squamous Epithelial / HPF: NONE SEEN /HPF (ref 0–5)
pH: 6 (ref 5.0–8.0)

## 2023-03-02 LAB — ACETAMINOPHEN LEVEL: Acetaminophen (Tylenol), Serum: <10 ug/mL — ABNORMAL LOW (ref 10–30)

## 2023-03-02 LAB — PREGNANCY, URINE: Preg Test, Ur: NEGATIVE

## 2023-03-02 LAB — ETHANOL: Alcohol, Ethyl (B): 219 mg/dL — ABNORMAL HIGH (ref <10)

## 2023-03-02 LAB — SALICYLATE LEVEL: Salicylate Lvl: <7 mg/dL — ABNORMAL LOW (ref 7.0–30.0)

## 2023-03-02 MED ORDER — LORAZEPAM 2 MG PO TABS: 2.0000 mg | ORAL_TABLET | Freq: Three times a day (TID) | ORAL | Status: DC | PRN

## 2023-03-02 MED ORDER — LORAZEPAM 2 MG/ML IJ SOLN: 2.0000 mg | Freq: Three times a day (TID) | INTRAMUSCULAR | Status: DC | PRN

## 2023-03-02 MED ORDER — HALOPERIDOL LACTATE 5 MG/ML IJ SOLN: 5.0000 mg | Freq: Three times a day (TID) | INTRAMUSCULAR | Status: DC | PRN

## 2023-03-02 MED ORDER — POTASSIUM CHLORIDE CRYS ER 20 MEQ PO TBCR
40.0000 meq | EXTENDED_RELEASE_TABLET | Freq: Once | ORAL | Status: AC
  Administered 2023-03-02: 40 meq via ORAL

## 2023-03-02 MED ORDER — LORAZEPAM 1 MG PO TABS
1.0000 mg | ORAL_TABLET | Freq: Once | ORAL | Status: AC
  Administered 2023-03-02: 1 mg via ORAL
  Filled 2023-03-02: qty 1

## 2023-03-02 MED ORDER — LOPERAMIDE HCL 2 MG PO CAPS: 2.0000 mg | ORAL_CAPSULE | ORAL | Status: AC | PRN

## 2023-03-02 MED ORDER — ENSURE ENLIVE PO LIQD
237.0000 mL | Freq: Two times a day (BID) | ORAL | Status: DC
  Administered 2023-03-03 – 2023-03-07 (×10): 237 mL via ORAL

## 2023-03-02 MED ORDER — MAGNESIUM HYDROXIDE 400 MG/5ML PO SUSP: 30.0000 mL | Freq: Every day | ORAL | Status: DC | PRN

## 2023-03-02 MED ORDER — LORAZEPAM 1 MG PO TABS: 1.0000 mg | ORAL_TABLET | Freq: Four times a day (QID) | ORAL | Status: AC | PRN

## 2023-03-02 MED ORDER — DIPHENHYDRAMINE HCL 50 MG/ML IJ SOLN: 50.0000 mg | Freq: Three times a day (TID) | INTRAMUSCULAR | Status: DC | PRN

## 2023-03-02 MED ORDER — THIAMINE MONONITRATE 100 MG PO TABS
100.0000 mg | ORAL_TABLET | Freq: Every day | ORAL | Status: DC
  Administered 2023-03-03 – 2023-03-07 (×5): 100 mg via ORAL
  Filled 2023-03-02 (×5): qty 1

## 2023-03-02 MED ORDER — ONDANSETRON 4 MG PO TBDP: 4.0000 mg | ORAL_TABLET | Freq: Four times a day (QID) | ORAL | Status: AC | PRN

## 2023-03-02 MED ORDER — ALUM & MAG HYDROXIDE-SIMETH 200-200-20 MG/5ML PO SUSP: 30.0000 mL | ORAL | Status: DC | PRN

## 2023-03-02 MED ORDER — HYDROXYZINE HCL 50 MG PO TABS
50.0000 mg | ORAL_TABLET | Freq: Three times a day (TID) | ORAL | Status: DC | PRN
  Administered 2023-03-02 – 2023-03-03 (×3): 50 mg via ORAL
  Filled 2023-03-02 (×4): qty 1

## 2023-03-02 MED ORDER — THIAMINE HCL 100 MG/ML IJ SOLN
100.0000 mg | Freq: Once | INTRAMUSCULAR | Status: AC
  Administered 2023-03-03: 100 mg via INTRAMUSCULAR
  Filled 2023-03-02: qty 1

## 2023-03-02 MED ORDER — ACETAMINOPHEN 325 MG PO TABS
650.0000 mg | ORAL_TABLET | Freq: Four times a day (QID) | ORAL | Status: DC | PRN
  Administered 2023-03-02: 650 mg via ORAL
  Filled 2023-03-02: qty 2

## 2023-03-02 MED ORDER — ADULT MULTIVITAMIN W/MINERALS CH
1.0000 | ORAL_TABLET | Freq: Every day | ORAL | Status: DC
  Administered 2023-03-03 – 2023-03-07 (×5): 1 via ORAL
  Filled 2023-03-02 (×6): qty 1

## 2023-03-02 MED ORDER — DIPHENHYDRAMINE HCL 25 MG PO CAPS
50.0000 mg | ORAL_CAPSULE | Freq: Three times a day (TID) | ORAL | Status: DC | PRN
  Administered 2023-03-06: 50 mg via ORAL
  Filled 2023-03-02: qty 2

## 2023-03-02 MED ORDER — TRAZODONE HCL 50 MG PO TABS
50.0000 mg | ORAL_TABLET | Freq: Every evening | ORAL | Status: DC | PRN
  Administered 2023-03-02 – 2023-03-05 (×3): 50 mg via ORAL
  Filled 2023-03-02 (×3): qty 1

## 2023-03-02 MED ORDER — HALOPERIDOL 5 MG PO TABS: 5.0000 mg | ORAL_TABLET | Freq: Three times a day (TID) | ORAL | Status: DC | PRN

## 2023-03-02 NOTE — ED Provider Notes (Signed)
[facility] Provider Note    Event Date/Time   First MD Initiated Contact with Patient 03/02/23 0234     (approximate)   History   Psychiatric Evaluation   HPI  Isabella Clements is a 37 y.o. female brought to the ED via a SSD under IVC for behavioral medicine evaluation.  Patient was found in the road, holding a knife and expressed suicidal ideation.  Endorses SI and AH.  Admits to EtOH and using crack cocaine.  Voices no medical complaints.     Past Medical History  History reviewed. No pertinent past medical history.   Active Problem List   Patient Active Problem List   Diagnosis Date Noted   Major depressive disorder, single episode, severe without psychotic features (HCC) 04/04/2021   Alcohol use disorder, moderate, dependence (HCC) 04/04/2021   Cocaine abuse (HCC) 04/04/2021   Major depressive disorder, single episode, severe without psychosis (HCC) 04/04/2021     Past Surgical History  History reviewed. No pertinent surgical history.   Home Medications   Prior to Admission medications   Medication Sig Start Date End Date Taking? Authorizing Provider  escitalopram (LEXAPRO) 10 MG tablet Take 1 tablet (10 mg total) by mouth daily. 04/09/21   Vanetta Mulders, NP  escitalopram (LEXAPRO) 10 MG tablet Take 1 tablet (10 mg total) by mouth daily. 04/09/21   Vanetta Mulders, NP  folic acid (FOLVITE) 1 MG tablet Take 1 tablet (1 mg total) by mouth daily. 04/09/21   Vanetta Mulders, NP  folic acid (FOLVITE) 1 MG tablet Take 1 tablet (1 mg total) by mouth daily. 04/09/21   Vanetta Mulders, NP  hydrOXYzine (ATARAX/VISTARIL) 25 MG tablet Take 1 tablet (25 mg total) by mouth 3 (three) times daily as needed for anxiety. 04/08/21   Vanetta Mulders, NP  hydrOXYzine (ATARAX/VISTARIL) 25 MG tablet Take 1 tablet (25 mg total) by mouth 3 (three) times daily as needed for anxiety. 04/08/21   Vanetta Mulders, NP  lisinopril (ZESTRIL) 10 MG tablet Take 1  tablet (10 mg total) by mouth daily. 04/09/21   Vanetta Mulders, NP  lisinopril (ZESTRIL) 10 MG tablet Take 1 tablet (10 mg total) by mouth daily. 04/09/21   Vanetta Mulders, NP  thiamine (VITAMIN B-1) 100 MG tablet Take 1 tablet (100 mg total) by mouth daily. 04/09/21   Vanetta Mulders, NP     Allergies  Patient has no known allergies.   Family History  History reviewed. No pertinent family history.   Physical Exam  Triage Vital Signs: ED Triage Vitals  Enc Vitals Group     BP 03/02/23 0204 (!) 136/94     Pulse Rate 03/02/23 0204 100     Resp 03/02/23 0204 20     Temp 03/02/23 0204 98.4 F (36.9 C)     Temp Source 03/02/23 0204 Oral     SpO2 03/02/23 0204 100 %     Weight --      Height --      Head Circumference --      Peak Flow --      Pain Score 03/02/23 0205 0     Pain Loc --      Pain Edu? --      Excl. in GC? --     Updated Vital Signs: BP (!) 136/94   Pulse 100   Temp 98.4 F (36.9 C) (Oral)   Resp 20   SpO2 100%  General: Awake, no distress.  Disheveled. CV:  RRR.  Good peripheral perfusion.  Resp:  Normal effort.  CTAB. Abd:  Nontender.  No distention.  Other:  Cooperative with periods of animated speech but able to be verbally redirected.   ED Results / Procedures / Treatments  Labs (all labs ordered are listed, but only abnormal results are displayed) Labs Reviewed  COMPREHENSIVE METABOLIC PANEL - Abnormal; Notable for the following components:      Result Value   Sodium 133 (*)    Potassium 3.1 (*)    CO2 20 (*)    Glucose, Bld 132 (*)    Creatinine, Ser 1.44 (*)    Total Protein 8.3 (*)    Albumin 5.1 (*)    GFR, Estimated 49 (*)    All other components within normal limits  ETHANOL - Abnormal; Notable for the following components:   Alcohol, Ethyl (B) 219 (*)    All other components within normal limits  SALICYLATE LEVEL - Abnormal; Notable for the following components:   Salicylate Lvl <7.0 (*)    All other components within  normal limits  ACETAMINOPHEN LEVEL - Abnormal; Notable for the following components:   Acetaminophen (Tylenol), Serum <10 (*)    All other components within normal limits  CBC - Abnormal; Notable for the following components:   WBC 22.3 (*)    RBC 5.45 (*)    Hemoglobin 16.4 (*)    MCHC 36.7 (*)    Platelets 447 (*)    All other components within normal limits  URINE DRUG SCREEN, QUALITATIVE ([facility] ONLY) - Abnormal; Notable for the following components:   Cocaine Metabolite,Ur [employer] POSITIVE (*)    Cannabinoid 50 Ng, Ur [place] POSITIVE (*)    All other components within normal limits  URINALYSIS, ROUTINE W REFLEX MICROSCOPIC - Abnormal; Notable for the following components:   Color, Urine STRAW (*)    APPearance CLEAR (*)    Specific Gravity, Urine 1.002 (*)    Hgb urine dipstick MODERATE (*)    All other components within normal limits  CBC WITH DIFFERENTIAL/PLATELET - Abnormal; Notable for the following components:   WBC 22.3 (*)    RBC 5.45 (*)    Hemoglobin 16.4 (*)    MCHC 36.8 (*)    Platelets 456 (*)    Neutro Abs 15.3 (*)    Lymphs Abs 4.9 (*)    Monocytes Absolute 1.2 (*)    Eosinophils Absolute 0.6 (*)    Basophils Absolute 0.2 (*)    Abs Immature Granulocytes 0.12 (*)    All other components within normal limits  PREGNANCY, URINE     EKG  None   RADIOLOGY I have independently visualized and interpreted patient's x-ray as well as noted the radiology interpretation:  Chest x-ray: No acute cardiopulmonary process   Official radiology report(s): DG Chest 2 View  Result Date: 03/02/2023 CLINICAL DATA:  leukocytosis EXAM: CHEST - 2 VIEW COMPARISON:  Chest x-ray 11/10/2017 FINDINGS: The heart and mediastinal contours are within normal limits. Likely nipple shadows bilaterally overlie the chest. No focal consolidation. No pulmonary edema. No pleural effusion. No pneumothorax. No acute osseous abnormality. IMPRESSION: 1. No active cardiopulmonary disease. 2. Likely  nipple shadows bilaterally overlie the chest. Recommend frontal view with nipple markers. Electronically Signed   By: Tish Frederickson M.D.   On: 03/02/2023 03:13     PROCEDURES:  Critical Care performed: No  Procedures   MEDICATIONS ORDERED IN ED: Medications  LORazepam (ATIVAN) tablet  1 mg (1 mg Oral Given 03/02/23 0323)  potassium chloride SA (KLOR-CON M) CR tablet 40 mEq (40 mEq Oral Given 03/02/23 0324)     IMPRESSION / MDM / ASSESSMENT AND PLAN / ED COURSE  I reviewed the triage vital signs and the nursing notes.                             37 year old female brought in under IVC for SI. The patient has been placed in psychiatric observation due to the need to provide a safe environment for the patient while obtaining psychiatric consultation and evaluation, as well as ongoing medical and medication management to treat the patient's condition.  The patient has been placed under full IVC at this time.   Patient's presentation is most consistent with severe exacerbation of chronic illness.  Leukocytosis noted with WBC 22, will add differential and chest x-ray.  Clinical Course as of 03/02/23 0544  Thu Mar 02, 2023  0320 Remaining laboratory results demonstrate mild hypokalemia with potassium 3.1, mild AKI, elevated EtOH, negative acetaminophen/salicylate.  UA and chest x-ray negative.  No evidence of infection.  Patient will hydrate orally, replete potassium.  At this time patient is medically cleared for psychiatric evaluation and disposition. [JS]    Clinical Course User Index [JS] Irean Hong, MD     FINAL CLINICAL IMPRESSION(S) / ED DIAGNOSES   Final diagnoses:  Hypokalemia  AKI (acute kidney injury) (HCC)  Depression, unspecified depression type     Rx / DC Orders   ED Discharge Orders     None        Note:  This document was prepared using Dragon voice recognition software and may include unintentional dictation errors.   Irean Hong, MD 03/02/23  [phone]

## 2023-03-02 NOTE — ED Notes (Signed)
Pt given meal tray at this time 

## 2023-03-02 NOTE — ED Notes (Signed)
ivc prior to arrival/psych consult oredered/pending.Marland Kitchen

## 2023-03-02 NOTE — Progress Notes (Addendum)
Patient ID: Isabella Clements, female   DOB: 1986-04-11, 37 y.o.   MRN: [id number]  Isabella Clements, likes to be called "[name]," 37 year old female admitted to the [facility] after being brought to the [facility] ED under IVC after being found outside in the road with a knife stating she wants to hurt herself. Patient endorses using crack/cocaine for the last three years. UDS positive for cocaine and THC. Patient endorses AH, she states, "they tell me lots of things," but does not elaborate on what specifically they say. Denies VH. Denies current SI/HI. Old self harm marks noted to bilateral arms. Pt oriented to unit, given new scrubs to put on, q7min checks in place.

## 2023-03-02 NOTE — ED Triage Notes (Signed)
Pt to ED via [facility] involuntarily for psych eval. Pt was found in road holding a knife and stated she "just wants to die". patient endorses SI and auditory hallucinations. Pt denies taking any medication. Pt admits to drinking alcohol and using crack cocaine tonight. Denies CP, SOB, fevers

## 2023-03-02 NOTE — ED Notes (Signed)
ivc/pending consult.. 

## 2023-03-02 NOTE — BH Assessment (Signed)
Patient is to be admitted to [facility] by Psychiatric Nurse Practitioner  [place] .  Attending Physician will be Dr. Marlou Porch.   Patient has been assigned to room 302, by [facility] Charge Nurse Nicki.   Intake Paper Work has been signed and placed on patient chart.  ER staff is aware of the admission: Pattricia Boss,  Patient's Nurse  Ethelene Browns, Patient Access.  Bed available after 3pm.

## 2023-03-02 NOTE — BH Assessment (Signed)
Comprehensive Clinical Assessment (CCA) Screening, Triage and Referral Note  03/02/2023 Isabella Clements [id number] Recommendations for Services/Supports/Treatments: Psych consult/disposition pending. Isabella Clements is a 37 y.o., Non-Hispanic or Latino ethnicity, English speaking female with a history of alcohol use disorder, moderate dependence, MDD single episode severe w/o psychotic features, and cocaine abuse. Per triage note: Pt to ED via [facility] involuntarily for psych eval. Pt was found in road holding a knife and stated she "just wants to die". patient endorses SI and auditory hallucinations. Pt denies taking any medication. Pt admits to drinking alcohol and using crack cocaine tonight.  Pt presented with depressed mood and a congruent affect. Pt was drowsy; and intoxicated however, pt. was able to answer some questions. Pt was unable to recall why she'd presented to the ED or how she'd arrived; however, pt. was oriented x3. Pt's speech was slurred and difficult to follow. Pt identified the reason she'd presented to the ED as "having a lot going on in her life". Pt was unable to identify specific stressors; however, she complained of hearing voices and being unable to stop it. Pt reported that she lives with her mother and that their relationship is strained. Pt reported drinking prior to arrival; however, pt. was unable to specify what type or size of the drinks consumed. Pt reported smoking cocaine, daily. Pt had fair insight and impaired judgment. Pt expressed a desire for help with her substance abuse; however, pt. was unable to answer any follow up questions. Pt presented with slowed psychomotor activity. The patient was able to endorse having thoughts of SI at times both under the influence and when sober. Pt unable to confirm or deny current SI/HI or V/H due to falling asleep. Pt's BAL is 219; UDS + for cocaine and cannabis.  Chief Complaint:  Chief Complaint  Patient presents with    Psychiatric Evaluation   Visit Diagnosis:  Major depressive disorder, single episode, severe without psychosis (HCC) Active Problems:   Alcohol use disorder, moderate, dependence (HCC)   Cocaine abuse (HCC)  Patient Reported Information How did you hear about [place]? No data recorded What Is the Reason for Your Visit/Call Today? No data recorded How Long Has This Been Causing You Problems? No data recorded What Do You Feel Would Help You the Most Today? No data recorded  Have You Recently Had Any Thoughts About Hurting Yourself? No data recorded Are You Planning to Commit Suicide/Harm Yourself At This time? No data recorded  Have you Recently Had Thoughts About Hurting Someone Isabella Clements? No data recorded Are You Planning to Harm Someone at This Time? No data recorded Explanation: No data recorded  Have You Used Any Alcohol or Drugs in the Past 24 Hours? No data recorded How Long Ago Did You Use Drugs or Alcohol? No data recorded What Did You Use and How Much? No data recorded  Do You Currently Have a Therapist/Psychiatrist? No data recorded Name of Therapist/Psychiatrist: No data recorded  Have You Been Recently Discharged From Any Office Practice or Programs? No data recorded Explanation of Discharge From Practice/Program: No data recorded   CCA Screening Triage Referral Assessment Type of Contact: No data recorded Telemedicine Service Delivery:   Is this Initial or Reassessment?   Date Telepsych consult ordered in [facility]:    Time Telepsych consult ordered in [facility]:    Location of Assessment: No data recorded Provider Location: No data recorded   Collateral Involvement: No data recorded  Does Patient Have a Court Appointed Legal Guardian? No  data recorded Name and Contact of Legal Guardian: No data recorded If Minor and Not Living with Parent(s), Who has Custody? No data recorded Is CPS involved or ever been involved? No data recorded Is APS involved or ever been involved? No data  recorded  Patient Determined To Be At Risk for Harm To Self or Others Based on Review of Patient Reported Information or Presenting Complaint? No data recorded Method: No data recorded Availability of Means: No data recorded Intent: No data recorded Notification Required: No data recorded Additional Information for Danger to Others Potential: No data recorded Additional Comments for Danger to Others Potential: No data recorded Are There Guns or Other Weapons in Your Home? No data recorded Types of Guns/Weapons: No data recorded Are These Weapons Safely Secured?                            No data recorded Who Could Verify You Are Able To Have These Secured: No data recorded Do You Have any Outstanding Charges, Pending Court Dates, Parole/Probation? No data recorded Contacted To Inform of Risk of Harm To Self or Others: No data recorded  Does Patient Present under Involuntary Commitment? No data recorded   [place] of Residence: No data recorded  Patient Currently Receiving the Following Services: No data recorded  Determination of Need: No data recorded  Options For Referral: No data recorded  Discharge Disposition:     Laura Radilla R Isabella Clements, LCAS

## 2023-03-02 NOTE — Consult Note (Signed)
[facility] Face-to-Face Psychiatry Consult   Reason for Consult:  suicidal ideation Referring Physician:  Chiquita Loth MD Patient Identification: Isabella Clements MRN:  [id number] Principal Diagnosis: <principal problem not specified> Diagnosis:  Active Problems:   Suicidal ideation   Total Time spent with patient: 30 minutes  Subjective:   Isabella C Turneris a 37 y.o. female patient admitted under IVC for suicidal ideation. Per her record, patient was found in the road, holding a knife. Patient states "I want to hurt the people in my head".  HPI:  Isabella Clements is a 37 y.o. female patient admitted under IVC for suicidal ideation. Per her record, patient was found in the road, holding a knife. She denies a prior hx of psychiatric diagnoses, but her medical record indicates MDD, alcohol use disorder, and cocaine abuse. She reports one prior psychiatric hospitalization and suicide attempt, stating she cut her wrist a "couple of years ago". Patient showed heal scar on L wrist.  Medical record indicates a prior psychiatric hospitalization at [facility] from 03/25/21 until 04/08/21, where she presented with SI and picking up a knife, similar to today's presentation. Patient reports drinking a pint of Isabella Clements yesterday and smoking crack cocaine. She reports starting to drink alcohol at 37 yo and started smoking crack for the past 3 years. She reports auditory hallucinations that repeat her name, call her ugly, "Isabella Clements", "I love you", and voices tell her to eat. She reports visual hallucinations, stating I see things in the sky but is unable to elaborate further. Patient states "I want to hurt the voices in my head". She reports that AVH started after starting crack cocaine, "everybody blames it on crack". Patient does not believe her hallucinations are induced by crack use. Patient requesting inpatient treatment, stating she wants help. BAC 219, UDS positive for cocaine and cannabinoid. She denies  being established with an outpatient psychiatric provider or taking psychiatric medications to manage symptoms.  She states she lives with her mother, Isabella Clements and authorized staff to speak with her mother regarding care. 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  Past Psychiatric History: MDD, cocaine abuse, alcohol use disorder  Risk to Self:  yes. SI Risk to Others:  no imminent threat to others. Patient experiencing AVH and states she wants to harm the people in her head. Prior Inpatient Therapy:  Yes. See above Prior Outpatient Therapy:  None known  Past Medical History: History reviewed. No pertinent past medical history. History reviewed. No pertinent surgical history. Family History: History reviewed. No pertinent family history. Family Psychiatric  History: none known Social History:  Social History   Substance and Sexual Activity  Alcohol Use Yes   Alcohol/week: 7.0 standard drinks of alcohol   Types: 7 Cans of beer per week   Comment: pt states, "a beer a day"     Social History   Substance and Sexual Activity  Drug Use No    Social History   Socioeconomic History   Marital status: Single    Spouse name: Not on file   Number of children: Not on file   Years of education: Not on file   Highest education level: Not on file  Occupational History   Not on file  Tobacco Use   Smoking status: Every Day    Packs/day: .5    Types: Cigarettes   Smokeless tobacco: Never  Substance and Sexual Activity   Alcohol use: Yes    Alcohol/week: 7.0 standard drinks  of alcohol    Types: 7 Cans of beer per week    Comment: pt states, "a beer a day"   Drug use: No   Sexual activity: Yes    Birth control/protection: None  Other Topics Concern   Not on file  Social History Narrative   Not on file   Social Determinants of Health   Financial Resource Strain: Not on file  Food Insecurity: Not on file  Transportation Needs: Not on file  Physical  Activity: Not on file  Stress: Not on file  Social Connections: Not on file   Additional Social History:    Allergies:  No Known Allergies  Labs:  Results for orders placed or performed during the hospital encounter of 03/02/23 (from the past 48 hour(s))  Urine Drug Screen, Qualitative     Status: Abnormal   Collection Time: 03/02/23  2:06 AM  Result Value Ref Range   Tricyclic, Ur Screen NONE DETECTED NONE DETECTED   Amphetamines, Ur Screen NONE DETECTED NONE DETECTED   MDMA (Ecstasy)Ur Screen NONE DETECTED NONE DETECTED   Cocaine Metabolite,Ur [place] POSITIVE (A) NONE DETECTED   Opiate, Ur Screen NONE DETECTED NONE DETECTED   Phencyclidine (PCP) Ur S NONE DETECTED NONE DETECTED   Cannabinoid 50 Ng, Ur [place] POSITIVE (A) NONE DETECTED   Barbiturates, Ur Screen NONE DETECTED NONE DETECTED   Benzodiazepine, Ur Scrn NONE DETECTED NONE DETECTED   Methadone Scn, Ur NONE DETECTED NONE DETECTED    Comment: (NOTE) Tricyclics + metabolites, urine    Cutoff 1000 ng/mL Amphetamines + metabolites, urine  Cutoff 1000 ng/mL MDMA (Ecstasy), urine              Cutoff 500 ng/mL Cocaine Metabolite, urine          Cutoff 300 ng/mL Opiate + metabolites, urine        Cutoff 300 ng/mL Phencyclidine (PCP), urine         Cutoff 25 ng/mL Cannabinoid, urine                 Cutoff 50 ng/mL Barbiturates + metabolites, urine  Cutoff 200 ng/mL Benzodiazepine, urine              Cutoff 200 ng/mL Methadone, urine                   Cutoff 300 ng/mL  The urine drug screen provides only a preliminary, unconfirmed analytical test result and should not be used for non-medical purposes. Clinical consideration and professional judgment should be applied to any positive drug screen result due to possible interfering substances. A more specific alternate chemical method must be used in order to obtain a confirmed analytical result. Gas chromatography / mass spectrometry (GC/MS) is the preferred confirm atory  method. Performed at [facility], [address] [place], [place] [address]   Comprehensive metabolic panel     Status: Abnormal   Collection Time: 03/02/23  2:07 AM  Result Value Ref Range   Sodium 133 (L) 135 - 145 mmol/L   Potassium 3.1 (L) 3.5 - 5.1 mmol/L   Chloride 101 98 - 111 mmol/L   CO2 20 (L) 22 - 32 mmol/L   Glucose, Bld 132 (H) 70 - 99 mg/dL    Comment: Glucose reference range applies only to samples taken after fasting for at least 8 hours.   BUN 18 6 - 20 mg/dL   Creatinine, Ser 2.59 (H) 0.44 - 1.00 mg/dL   Calcium 9.0 8.9 - 10.3  mg/dL   Total Protein 8.3 (H) 6.5 - 8.1 g/dL   Albumin 5.1 (H) 3.5 - 5.0 g/dL   AST 24 15 - 41 U/L   ALT 23 0 - 44 U/L   Alkaline Phosphatase 70 38 - 126 U/L   Total Bilirubin 0.9 0.3 - 1.2 mg/dL   GFR, Estimated 49 (L) >60 mL/min    Comment: (NOTE) Calculated using the CKD-EPI Creatinine Equation (2021)    Anion gap 12 5 - 15    Comment: Performed at [facility], [address] [place], [place] [address]  Ethanol     Status: Abnormal   Collection Time: 03/02/23  2:07 AM  Result Value Ref Range   Alcohol, Ethyl (B) 219 (H) <10 mg/dL    Comment: (NOTE) Lowest detectable limit for serum alcohol is 10 mg/dL.  For medical purposes only. Performed at [facility], [address] [place], [place] [address]   Salicylate level     Status: Abnormal   Collection Time: 03/02/23  2:07 AM  Result Value Ref Range   Salicylate Lvl <7.0 (L) 7.0 - 30.0 mg/dL    Comment: Performed at [facility], [address] [place], [place] [address]  Acetaminophen level     Status: Abnormal   Collection Time: 03/02/23  2:07 AM  Result Value Ref Range   Acetaminophen (Tylenol), Serum <10 (L) 10 - 30 ug/mL    Comment: (NOTE) Therapeutic concentrations vary significantly. A range of 10-30 ug/mL  may be an effective concentration for many patients. However, some  are best treated at concentrations outside of this  range. Acetaminophen concentrations >150 ug/mL at 4 hours after ingestion  and >50 ug/mL at 12 hours after ingestion are often associated with  toxic reactions.  Performed at [facility], [address] [place], [place] [address]   cbc     Status: Abnormal   Collection Time: 03/02/23  2:07 AM  Result Value Ref Range   WBC 22.3 (H) 4.0 - 10.5 K/uL   RBC 5.45 (H) 3.87 - 5.11 MIL/uL   Hemoglobin 16.4 (H) 12.0 - 15.0 g/dL   HCT 01.0 27.2 - 53.6 %   MCV 82.0 80.0 - 100.0 fL   MCH 30.1 26.0 - 34.0 pg   MCHC 36.7 (H) 30.0 - 36.0 g/dL   RDW 64.4 03.4 - 74.2 %   Platelets 447 (H) 150 - 400 K/uL   nRBC 0.0 0.0 - 0.2 %    Comment: Performed at [facility], [address] [place], [place] [address]  Pregnancy, urine     Status: None   Collection Time: 03/02/23  2:07 AM  Result Value Ref Range   Preg Test, Ur NEGATIVE NEGATIVE    Comment:        THE SENSITIVITY OF THIS METHODOLOGY IS >25 mIU/mL. Performed at [facility], [address] [place], [place] [address]   Urinalysis, Routine w reflex microscopic -Urine, Clean Catch     Status: Abnormal   Collection Time: 03/02/23  2:07 AM  Result Value Ref Range   Color, Urine STRAW (A) YELLOW   APPearance CLEAR (A) CLEAR   Specific Gravity, Urine 1.002 (L) 1.005 - 1.030   pH 6.0 5.0 - 8.0   Glucose, UA NEGATIVE NEGATIVE mg/dL   Hgb urine dipstick MODERATE (A) NEGATIVE   Bilirubin Urine NEGATIVE NEGATIVE   Ketones, ur NEGATIVE NEGATIVE mg/dL   Protein, ur NEGATIVE NEGATIVE mg/dL   Nitrite NEGATIVE NEGATIVE  Leukocytes,Ua NEGATIVE NEGATIVE   RBC / HPF 0-5 0 - 5 RBC/hpf   WBC, UA 0-5 0 - 5 WBC/hpf   Bacteria, UA NONE SEEN NONE SEEN   Squamous Epithelial / HPF NONE SEEN 0 - 5 /HPF    Comment: Performed at [facility], [address] [place], [place] [address]  CBC with Differential/Platelet     Status: Abnormal   Collection Time: 03/02/23  2:07 AM  Result Value Ref Range   WBC 22.3 (H) 4.0  - 10.5 K/uL   RBC 5.45 (H) 3.87 - 5.11 MIL/uL   Hemoglobin 16.4 (H) 12.0 - 15.0 g/dL   HCT 38.1 82.9 - 93.7 %   MCV 81.8 80.0 - 100.0 fL   MCH 30.1 26.0 - 34.0 pg   MCHC 36.8 (H) 30.0 - 36.0 g/dL   RDW 16.9 67.8 - 93.8 %   Platelets 456 (H) 150 - 400 K/uL   nRBC 0.0 0.0 - 0.2 %   Neutrophils Relative % 68 %   Neutro Abs 15.3 (H) 1.7 - 7.7 K/uL   Lymphocytes Relative 22 %   Lymphs Abs 4.9 (H) 0.7 - 4.0 K/uL   Monocytes Relative 5 %   Monocytes Absolute 1.2 (H) 0.1 - 1.0 K/uL   Eosinophils Relative 3 %   Eosinophils Absolute 0.6 (H) 0.0 - 0.5 K/uL   Basophils Relative 1 %   Basophils Absolute 0.2 (H) 0.0 - 0.1 K/uL   Immature Granulocytes 1 %   Abs Immature Granulocytes 0.12 (H) 0.00 - 0.07 K/uL    Comment: Performed at [facility], [address] [place], [place] [address]    No current facility-administered medications for this encounter.   Current Outpatient Medications  Medication Sig Dispense Refill   escitalopram (LEXAPRO) 10 MG tablet Take 1 tablet (10 mg total) by mouth daily. 7 tablet 0   escitalopram (LEXAPRO) 10 MG tablet Take 1 tablet (10 mg total) by mouth daily. 30 tablet 1   folic acid (FOLVITE) 1 MG tablet Take 1 tablet (1 mg total) by mouth daily. 7 tablet 0   folic acid (FOLVITE) 1 MG tablet Take 1 tablet (1 mg total) by mouth daily. 30 tablet 0   hydrOXYzine (ATARAX/VISTARIL) 25 MG tablet Take 1 tablet (25 mg total) by mouth 3 (three) times daily as needed for anxiety. 14 tablet 0   hydrOXYzine (ATARAX/VISTARIL) 25 MG tablet Take 1 tablet (25 mg total) by mouth 3 (three) times daily as needed for anxiety. 30 tablet 0   lisinopril (ZESTRIL) 10 MG tablet Take 1 tablet (10 mg total) by mouth daily. 7 tablet 0   lisinopril (ZESTRIL) 10 MG tablet Take 1 tablet (10 mg total) by mouth daily. 30 tablet 0   thiamine (VITAMIN B-1) 100 MG tablet Take 1 tablet (100 mg total) by mouth daily. 7 tablet 0    Musculoskeletal: Strength & Muscle Tone:  n/a Gait &  Station:  n/a Patient leans: N/A            Psychiatric Specialty Exam:  Presentation  General Appearance: Disheveled  Eye Contact:Minimal  Speech:Clear and Coherent  Speech Volume:Decreased  Handedness:No data recorded  Mood and Affect  Mood:Dysphoric  Affect:Congruent   Thought Process  Thought Processes:Coherent  Descriptions of Associations:Intact  Orientation:No data recorded Thought Content:Other (comment) (Hallucinations)  History of Schizophrenia/Schizoaffective disorder:No data recorded Duration of Psychotic Symptoms:No data recorded Hallucinations:Hallucinations: Auditory Description of Auditory Hallucinations: Repeating her name, calling her ugly...  Ideas of Reference:None  Suicidal  Thoughts:Suicidal Thoughts: Yes, Active SI Active Intent and/or Plan: With Intent; With Plan; With Access to Means  Homicidal Thoughts:Homicidal Thoughts: Yes, Passive (Patient states "i want to harm the people in my head") HI Passive Intent and/or Plan: Without Access to Means   Sensorium  Memory:Immediate Good; Recent Good; Remote Good  Judgment:Poor  Insight:Fair   Executive Functions  Concentration:Fair  Attention Span:Fair  Recall:Good  Fund of Knowledge:Good  Language:Good   Psychomotor Activity  Psychomotor Activity:Psychomotor Activity: Normal   Assets  Assets:Communication Skills; Social Support; Housing   Sleep  Sleep:No data recorded  Physical Exam: Physical Exam Vitals reviewed.  Neurological:     Mental Status: She is oriented to person, place, and time.    Review of Systems  Psychiatric/Behavioral:  Positive for depression, hallucinations, substance abuse and suicidal ideas. Negative for memory loss. The patient is not nervous/anxious.   All other systems reviewed and are negative.  Blood pressure (!) 136/94, pulse 100, temperature 98.4 F (36.9 C), temperature source Oral, resp. rate 20, SpO2 100 %. There is no  height or weight on file to calculate BMI.  Treatment Plan Summary: Daily contact with patient to assess and evaluate symptoms and progress in treatment. Patient voicing SI and IVC'd due to being found in the road with a knife. She has self harmed in the past, with healed scar to L wrist. Patient reports that she intended to kill herself with that laceration. She will be admitted to inpatient psychiatry.   Disposition: Recommend psychiatric Inpatient admission when medically cleared.  [name], NP 03/02/2023 11:49 AM

## 2023-03-02 NOTE — ED Notes (Signed)
Pt tearful, calm and cooperative at this time

## 2023-03-02 NOTE — Plan of Care (Signed)
  Problem: Education: Goal: Knowledge of disease or condition will improve Outcome: Not Progressing Goal: Understanding of discharge needs will improve Outcome: Not Progressing   Problem: Safety: Goal: Ability to remain free from injury will improve Outcome: Progressing   Problem: Coping: Goal: Coping ability will improve Outcome: Not Progressing

## 2023-03-02 NOTE — Tx Team (Signed)
Initial Treatment Plan 03/02/2023 4:25 PM Isabella Clements ZOX:[id number]    PATIENT STRESSORS: Substance abuse   Other: homelessness     PATIENT STRENGTHS: Ability for insight  Communication skills  Supportive family/friends    PATIENT IDENTIFIED PROBLEMS: Patient reports homelessness                      DISCHARGE CRITERIA:  Ability to meet basic life and health needs Verbal commitment to aftercare and medication compliance Withdrawal symptoms are absent or subacute and managed without 24-hour nursing intervention  PRELIMINARY DISCHARGE PLAN: Attend aftercare/continuing care group Outpatient therapy Return to previous living arrangement Return to previous work or school arrangements  PATIENT/FAMILY INVOLVEMENT: This treatment plan has been presented to and reviewed with the patient, Isabella [name] The patient and family have been given the opportunity to ask questions and make suggestions.  Cherre Blanc, RN 03/02/2023, 4:25 PM

## 2023-03-02 NOTE — ED Notes (Signed)
Pt dressed out by this Production manager. Pt cooperative with dressing out. Belongings placed in belongings bag

## 2023-03-03 DIAGNOSIS — F322 Major depressive disorder, single episode, severe without psychotic features: Secondary | ICD-10-CM

## 2023-03-03 MED ORDER — FOLIC ACID 1 MG PO TABS
1.0000 mg | ORAL_TABLET | Freq: Every day | ORAL | Status: DC
  Administered 2023-03-03 – 2023-03-07 (×5): 1 mg via ORAL
  Filled 2023-03-03 (×5): qty 1

## 2023-03-03 MED ORDER — DIAZEPAM 2 MG PO TABS
2.0000 mg | ORAL_TABLET | ORAL | Status: DC
  Administered 2023-03-03 – 2023-03-04 (×3): 2 mg via ORAL
  Filled 2023-03-03 (×3): qty 1

## 2023-03-03 MED ORDER — FLUOXETINE HCL 20 MG PO CAPS
20.0000 mg | ORAL_CAPSULE | Freq: Every day | ORAL | Status: DC
  Administered 2023-03-03 – 2023-03-07 (×5): 20 mg via ORAL
  Filled 2023-03-03 (×5): qty 1

## 2023-03-03 MED ORDER — RISPERIDONE 1 MG PO TABS
0.5000 mg | ORAL_TABLET | ORAL | Status: DC
  Administered 2023-03-03 – 2023-03-07 (×10): 0.5 mg via ORAL
  Filled 2023-03-03 (×10): qty 1

## 2023-03-03 NOTE — BH IP Treatment Plan (Addendum)
Interdisciplinary Treatment and Diagnostic Plan Update  03/03/2023 Time of Session: 9:33 AM  Isabella Clements MRN: [id number]  Principal Diagnosis: Suicidal ideation  Secondary Diagnoses: Principal Problem:   Suicidal ideation   Current Medications:  Current Facility-Administered Medications  Medication Dose Route Frequency Provider Last Rate Last Admin   acetaminophen (TYLENOL) tablet 650 mg  650 mg Oral Q6H PRN [name], NP   650 mg at 03/02/23 2110   alum & mag hydroxide-simeth (MAALOX/MYLANTA) 200-200-20 MG/5ML suspension 30 mL  30 mL Oral Q4H PRN [name], [name], NP       diphenhydrAMINE (BENADRYL) capsule 50 mg  50 mg Oral TID PRN [name], NP       Or   diphenhydrAMINE (BENADRYL) injection 50 mg  50 mg Intramuscular TID PRN [name], [name], NP       feeding supplement (ENSURE ENLIVE / ENSURE PLUS) liquid 237 mL  237 mL Oral BID BM [name], DO   237 mL at 03/03/23 0919   folic acid (FOLVITE) tablet 1 mg  1 mg Oral Daily [name], DO   1 mg at 03/03/23 7829   haloperidol (HALDOL) tablet 5 mg  5 mg Oral TID PRN [name], NP       Or   haloperidol lactate (HALDOL) injection 5 mg  5 mg Intramuscular TID PRN [name], NP       hydrOXYzine (ATARAX) tablet 50 mg  50 mg Oral TID PRN [name], NP   50 mg at 03/03/23 1009   loperamide (IMODIUM) capsule 2-4 mg  2-4 mg Oral PRN [name], DO       LORazepam (ATIVAN) tablet 2 mg  2 mg Oral TID PRN [name], NP       Or   LORazepam (ATIVAN) injection 2 mg  2 mg Intramuscular TID PRN [name], NP       LORazepam (ATIVAN) tablet 1 mg  1 mg Oral Q6H PRN [name], DO       magnesium hydroxide (MILK OF MAGNESIA) suspension 30 mL  30 mL Oral Daily PRN [name], [name], NP       multivitamin with minerals tablet 1 tablet  1 tablet Oral Daily [name], DO   1 tablet at 03/03/23 0917   ondansetron (ZOFRAN-ODT) disintegrating tablet 4 mg  4 mg Oral Q6H PRN  [name], DO       thiamine (VITAMIN B1) tablet 100 mg  100 mg Oral Daily [name], DO   100 mg at 03/03/23 5621   traZODone (DESYREL) tablet 50 mg  50 mg Oral QHS PRN [name], NP   50 mg at 03/02/23 2111   PTA Medications: Medications Prior to Admission  Medication Sig Dispense Refill Last Dose   escitalopram (LEXAPRO) 10 MG tablet Take 1 tablet (10 mg total) by mouth daily. 7 tablet 0    escitalopram (LEXAPRO) 10 MG tablet Take 1 tablet (10 mg total) by mouth daily. 30 tablet 1    folic acid (FOLVITE) 1 MG tablet Take 1 tablet (1 mg total) by mouth daily. 7 tablet 0    folic acid (FOLVITE) 1 MG tablet Take 1 tablet (1 mg total) by mouth daily. 30 tablet 0    hydrOXYzine (ATARAX/VISTARIL) 25 MG tablet Take 1 tablet (25 mg total) by mouth 3 (three) times daily as needed for anxiety. 14 tablet 0    hydrOXYzine (ATARAX/VISTARIL) 25 MG tablet Take 1 tablet (25 mg total)  by mouth 3 (three) times daily as needed for anxiety. 30 tablet 0    lisinopril (ZESTRIL) 10 MG tablet Take 1 tablet (10 mg total) by mouth daily. 7 tablet 0    lisinopril (ZESTRIL) 10 MG tablet Take 1 tablet (10 mg total) by mouth daily. 30 tablet 0    thiamine (VITAMIN B-1) 100 MG tablet Take 1 tablet (100 mg total) by mouth daily. 7 tablet 0     Patient Stressors: Substance abuse   Other: homelessness    Patient Strengths: Ability for insight  Communication skills  Supportive family/friends   Treatment Modalities: Medication Management, Group therapy, Case management,  1 to 1 session with clinician, Psychoeducation, Recreational therapy.   Physician Treatment Plan for Primary Diagnosis: Suicidal ideation Long Term Goal(s):     Short Term Goals:    Medication Management: Evaluate patient's response, side effects, and tolerance of medication regimen.  Therapeutic Interventions: 1 to 1 sessions, Unit Group sessions and Medication administration.  Evaluation of Outcomes: Not  Met  Physician Treatment Plan for Secondary Diagnosis: Principal Problem:   Suicidal ideation  Long Term Goal(s):     Short Term Goals:       Medication Management: Evaluate patient's response, side effects, and tolerance of medication regimen.  Therapeutic Interventions: 1 to 1 sessions, Unit Group sessions and Medication administration.  Evaluation of Outcomes: Not Met   RN Treatment Plan for Primary Diagnosis: Suicidal ideation Long Term Goal(s): Knowledge of disease and therapeutic regimen to maintain health will improve  Short Term Goals: Ability to remain free from injury will improve, Ability to verbalize frustration and anger appropriately will improve, Ability to demonstrate self-control, Ability to participate in decision making will improve, Ability to verbalize feelings will improve, Ability to disclose and discuss suicidal ideas, Ability to identify and develop effective coping behaviors will improve, and Compliance with prescribed medications will improve  Medication Management: RN will administer medications as ordered by provider, will assess and evaluate patient's response and provide education to patient for prescribed medication. RN will report any adverse and/or side effects to prescribing provider.  Therapeutic Interventions: 1 on 1 counseling sessions, Psychoeducation, Medication administration, Evaluate responses to treatment, Monitor vital signs and CBGs as ordered, Perform/monitor CIWA, COWS, AIMS and Fall Risk screenings as ordered, Perform wound care treatments as ordered.  Evaluation of Outcomes: Not Met   LCSW Treatment Plan for Primary Diagnosis: Suicidal ideation Long Term Goal(s): Safe transition to appropriate next level of care at discharge, Engage patient in therapeutic group addressing interpersonal concerns.  Short Term Goals: Engage patient in aftercare planning with referrals and resources, Increase social support, Increase ability to  appropriately verbalize feelings, Increase emotional regulation, Facilitate acceptance of mental health diagnosis and concerns, Facilitate patient progression through stages of change regarding substance use diagnoses and concerns, Identify triggers associated with mental health/substance abuse issues, and Increase skills for wellness and recovery  Therapeutic Interventions: Assess for all discharge needs, 1 to 1 time with Social worker, Explore available resources and support systems, Assess for adequacy in community support network, Educate family and significant other(s) on suicide prevention, Complete Psychosocial Assessment, Interpersonal group therapy.  Evaluation of Outcomes: Not Met   Progress in Treatment: Attending groups: No. Participating in groups: No. Taking medication as prescribed: Yes. Toleration medication: Yes. Family/Significant other contact made: No, will contact:  CSW will contact if given permission Patient understands diagnosis: Yes. Discussing patient identified problems/goals with staff: Yes. Medical problems stabilized or resolved: Yes. Denies suicidal/homicidal ideation: No. Issues/concerns  per patient self-inventory: No. Other: None identified   New problem(s) identified: No, Describe:  None identified   New Short Term/Long Term Goal(s):detox, elimination of symptoms of psychosis, medication management for mood stabilization; elimination of SI thoughts; development of comprehensive mental wellness/sobriety plan.   Patient Goals:  "Getting better, stop doing drugs"   Discharge Plan or Barriers: CSW will assist with appropriate discharge planning   Reason for Continuation of Hospitalization: Anxiety Hallucinations Medication stabilization Suicidal ideation  Estimated Length of Stay: 1 to 7 days   Last 3 [place] Suicide Severity Risk Score: Flowsheet Row Admission (Current) from 03/02/2023 in [facility] INPATIENT BEHAVIORAL MEDICINE Most recent reading at  03/02/2023  4:14 PM ED from 03/02/2023 in [facility] Emergency Department at [facility] Most recent reading at 03/02/2023  2:06 AM Admission (Discharged) from 04/04/2021 in [facility] INPATIENT BEHAVIORAL MEDICINE Most recent reading at 04/04/2021  5:10 PM  C-SSRS RISK CATEGORY No Risk High Risk High Risk       Last PHQ 2/9 Scores:     No data to display          Scribe for Treatment Team: Elza Rafter, LCSWA 03/03/2023 11:11 AM

## 2023-03-03 NOTE — H&P (Signed)
Psychiatric Admission Assessment Adult  Patient Identification: Isabella Clements MRN:  [id number] Date of Evaluation:  03/03/2023 Chief Complaint:  Suicidal ideation [R45.851] Principal Diagnosis: Major depressive disorder, single episode, severe without psychotic features (HCC) Diagnosis:  Principal Problem:   Major depressive disorder, single episode, severe without psychotic features (HCC) Active Problems:   Suicidal ideation  History of Present Illness: Isabella Clements a 37 y.o. female patient admitted under IVC for suicidal ideation. Per her record, patient was found in the road, holding a knife. Patient states "I want to hurt the people in my head".   Isabella Clements is a 37 y.o. female patient admitted under IVC for suicidal ideation. Per her record, patient was found in the road, holding a knife. She denies a prior hx of psychiatric diagnoses, but her medical record indicates MDD, alcohol use disorder, and cocaine abuse. She reports one prior psychiatric hospitalization and suicide attempt, stating she cut her wrist a "couple of years ago". Patient showed heal scar on L wrist.  Medical record indicates a prior psychiatric hospitalization at [facility] from 03/25/21 until 04/08/21, where she presented with SI and picking up a knife, similar to today's presentation. Patient reports drinking a pint of Aristocrat yesterday and smoking crack cocaine. She reports starting to drink alcohol at 37 yo and started smoking crack for the past 3 years. She reports auditory hallucinations that repeat her name, call her ugly, "Sitlaly you stink", "I love you", and voices tell her to eat. She reports visual hallucinations, stating I see things in the sky but is unable to elaborate further. Patient states "I want to hurt the voices in my head". She reports that AVH started after starting crack cocaine, "everybody blames it on crack". Patient does not believe her hallucinations are induced by crack use. Patient  requesting inpatient treatment, stating she wants help. BAC 219, UDS positive for cocaine and cannabinoid. She denies being established with an outpatient psychiatric provider or taking psychiatric medications to manage symptoms.  She states she lives with her mother, Isabella Clements and authorized staff to speak with her mother regarding care. 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  Associated Signs/Symptoms: Depression Symptoms:  depressed mood, suicidal thoughts with specific plan, (Hypo) Manic Symptoms:  Impulsivity, Irritable Mood, Anxiety Symptoms:  Social Anxiety, Psychotic Symptoms:  Paranoia, PTSD Symptoms: NA Total Time spent with patient: 1 hour  Past Psychiatric History: Polysubstance abuse and depression  Is the patient at risk to self? Yes.    Has the patient been a risk to self in the past 6 months? Yes.    Has the patient been a risk to self within the distant past? Yes.    Is the patient a risk to others? No.  Has the patient been a risk to others in the past 6 months? No.  Has the patient been a risk to others within the distant past? No.   [place] Scale:  Flowsheet Row Admission (Current) from 03/02/2023 in [facility] INPATIENT BEHAVIORAL MEDICINE Most recent reading at 03/02/2023  4:14 PM ED from 03/02/2023 in [facility] Emergency Department at [facility] Most recent reading at 03/02/2023  2:06 AM Admission (Discharged) from 04/04/2021 in [facility] INPATIENT BEHAVIORAL MEDICINE Most recent reading at 04/04/2021  5:10 PM  C-SSRS RISK CATEGORY No Risk High Risk High Risk        Prior Inpatient Therapy: Yes.   If yes, describe [facility] Prior Outpatient Therapy: Yes.   If yes, describe [facility]  Alcohol Screening: 1. How often do you have a drink containing alcohol?: 2 to 3 times a week 2. How many drinks containing alcohol do you have on a typical day when you are drinking?: 5 or 6 3. How often do you have six or more drinks on one occasion?:  Weekly AUDIT-C Score: 8 4. How often during the last year have you found that you were not able to stop drinking once you had started?: Weekly 5. How often during the last year have you failed to do what was normally expected from you because of drinking?: Monthly 6. How often during the last year have you needed a first drink in the morning to get yourself going after a heavy drinking session?: Monthly 7. How often during the last year have you had a feeling of guilt of remorse after drinking?: Monthly 8. How often during the last year have you been unable to remember what happened the night before because you had been drinking?: Monthly 9. Have you or someone else been injured as a result of your drinking?: No 10. Has a relative or friend or a doctor or another health worker been concerned about your drinking or suggested you cut down?: No Alcohol Use Disorder Identification Test Final Score (AUDIT): 19 Substance Abuse History in the last 12 months:  Yes.   Consequences of Substance Abuse: NA Previous Psychotropic Medications: Yes  Psychological Evaluations: Yes  Past Medical History: History reviewed. No pertinent past medical history. History reviewed. No pertinent surgical history. Family History: History reviewed. No pertinent family history. Family Psychiatric  History: Unremarkable Tobacco Screening:  Social History   Tobacco Use  Smoking Status Every Day   Packs/day: .5   Types: Cigarettes  Smokeless Tobacco Never    BH Tobacco Counseling     Are you interested in Tobacco Cessation Medications?  No, patient refused Counseled patient on smoking cessation:  Yes Reason Tobacco Screening Not Completed: Patient Refused Screening       Social History:  Social History   Substance and Sexual Activity  Alcohol Use Yes   Alcohol/week: 7.0 standard drinks of alcohol   Types: 7 Cans of beer per week   Comment: pt states, "a beer a day"     Social History   Substance and  Sexual Activity  Drug Use Yes   Types: Cocaine, "Crack" cocaine    Additional Social History:                           Allergies:  No Known Allergies Lab Results:  Results for orders placed or performed during the hospital encounter of 03/02/23 (from the past 48 hour(s))  Urine Drug Screen, Qualitative     Status: Abnormal   Collection Time: 03/02/23  2:06 AM  Result Value Ref Range   Tricyclic, Ur Screen NONE DETECTED NONE DETECTED   Amphetamines, Ur Screen NONE DETECTED NONE DETECTED   MDMA (Ecstasy)Ur Screen NONE DETECTED NONE DETECTED   Cocaine Metabolite,Ur [place] POSITIVE (A) NONE DETECTED   Opiate, Ur Screen NONE DETECTED NONE DETECTED   Phencyclidine (PCP) Ur S NONE DETECTED NONE DETECTED   Cannabinoid 50 Ng, Ur [place] POSITIVE (A) NONE DETECTED   Barbiturates, Ur Screen NONE DETECTED NONE DETECTED   Benzodiazepine, Ur Scrn NONE DETECTED NONE DETECTED   Methadone Scn, Ur NONE DETECTED NONE DETECTED    Comment: (NOTE) Tricyclics + metabolites, urine    Cutoff 1000 ng/mL Amphetamines + metabolites, urine  Cutoff  1000 ng/mL MDMA (Ecstasy), urine              Cutoff 500 ng/mL Cocaine Metabolite, urine          Cutoff 300 ng/mL Opiate + metabolites, urine        Cutoff 300 ng/mL Phencyclidine (PCP), urine         Cutoff 25 ng/mL Cannabinoid, urine                 Cutoff 50 ng/mL Barbiturates + metabolites, urine  Cutoff 200 ng/mL Benzodiazepine, urine              Cutoff 200 ng/mL Methadone, urine                   Cutoff 300 ng/mL  The urine drug screen provides only a preliminary, unconfirmed analytical test result and should not be used for non-medical purposes. Clinical consideration and professional judgment should be applied to any positive drug screen result due to possible interfering substances. A more specific alternate chemical method must be used in order to obtain a confirmed analytical result. Gas chromatography / mass spectrometry (GC/MS) is the  preferred confirm atory method. Performed at [facility], [address] [place], [place] [address]   Comprehensive metabolic panel     Status: Abnormal   Collection Time: 03/02/23  2:07 AM  Result Value Ref Range   Sodium 133 (L) 135 - 145 mmol/L   Potassium 3.1 (L) 3.5 - 5.1 mmol/L   Chloride 101 98 - 111 mmol/L   CO2 20 (L) 22 - 32 mmol/L   Glucose, Bld 132 (H) 70 - 99 mg/dL    Comment: Glucose reference range applies only to samples taken after fasting for at least 8 hours.   BUN 18 6 - 20 mg/dL   Creatinine, Ser 2.95 (H) 0.44 - 1.00 mg/dL   Calcium 9.0 8.9 - 62.1 mg/dL   Total Protein 8.3 (H) 6.5 - 8.1 g/dL   Albumin 5.1 (H) 3.5 - 5.0 g/dL   AST 24 15 - 41 U/L   ALT 23 0 - 44 U/L   Alkaline Phosphatase 70 38 - 126 U/L   Total Bilirubin 0.9 0.3 - 1.2 mg/dL   GFR, Estimated 49 (L) >60 mL/min    Comment: (NOTE) Calculated using the CKD-EPI Creatinine Equation (2021)    Anion gap 12 5 - 15    Comment: Performed at [facility], [address] [place], [place] [address]  Ethanol     Status: Abnormal   Collection Time: 03/02/23  2:07 AM  Result Value Ref Range   Alcohol, Ethyl (B) 219 (H) <10 mg/dL    Comment: (NOTE) Lowest detectable limit for serum alcohol is 10 mg/dL.  For medical purposes only. Performed at [facility], [address] [place], [place] [address]   Salicylate level     Status: Abnormal   Collection Time: 03/02/23  2:07 AM  Result Value Ref Range   Salicylate Lvl <7.0 (L) 7.0 - 30.0 mg/dL    Comment: Performed at [facility], [address] [place], [place] [address]  Acetaminophen level     Status: Abnormal   Collection Time: 03/02/23  2:07 AM  Result Value Ref Range   Acetaminophen (Tylenol), Serum <10 (L) 10 - 30 ug/mL    Comment: (NOTE) Therapeutic concentrations vary significantly. A range of 10-30 ug/mL  may be an effective concentration for many patients. However, some  are best treated  at  concentrations outside of this range. Acetaminophen concentrations >150 ug/mL at 4 hours after ingestion  and >50 ug/mL at 12 hours after ingestion are often associated with  toxic reactions.  Performed at [facility], [address] [place], [place] [address]   cbc     Status: Abnormal   Collection Time: 03/02/23  2:07 AM  Result Value Ref Range   WBC 22.3 (H) 4.0 - 10.5 K/uL   RBC 5.45 (H) 3.87 - 5.11 MIL/uL   Hemoglobin 16.4 (H) 12.0 - 15.0 g/dL   HCT 62.1 30.8 - 65.7 %   MCV 82.0 80.0 - 100.0 fL   MCH 30.1 26.0 - 34.0 pg   MCHC 36.7 (H) 30.0 - 36.0 g/dL   RDW 84.6 96.2 - 95.2 %   Platelets 447 (H) 150 - 400 K/uL   nRBC 0.0 0.0 - 0.2 %    Comment: Performed at [facility], [address] [place], [place] [address]  Pregnancy, urine     Status: None   Collection Time: 03/02/23  2:07 AM  Result Value Ref Range   Preg Test, Ur NEGATIVE NEGATIVE    Comment:        THE SENSITIVITY OF THIS METHODOLOGY IS >25 mIU/mL. Performed at [facility], [address] [place], [place] [address]   Urinalysis, Routine w reflex microscopic -Urine, Clean Catch     Status: Abnormal   Collection Time: 03/02/23  2:07 AM  Result Value Ref Range   Color, Urine STRAW (A) YELLOW   APPearance CLEAR (A) CLEAR   Specific Gravity, Urine 1.002 (L) 1.005 - 1.030   pH 6.0 5.0 - 8.0   Glucose, UA NEGATIVE NEGATIVE mg/dL   Hgb urine dipstick MODERATE (A) NEGATIVE   Bilirubin Urine NEGATIVE NEGATIVE   Ketones, ur NEGATIVE NEGATIVE mg/dL   Protein, ur NEGATIVE NEGATIVE mg/dL   Nitrite NEGATIVE NEGATIVE   Leukocytes,Ua NEGATIVE NEGATIVE   RBC / HPF 0-5 0 - 5 RBC/hpf   WBC, UA 0-5 0 - 5 WBC/hpf   Bacteria, UA NONE SEEN NONE SEEN   Squamous Epithelial / HPF NONE SEEN 0 - 5 /HPF    Comment: Performed at [facility], [address] [place], [place] [address]  CBC with Differential/Platelet     Status: Abnormal   Collection Time: 03/02/23  2:07 AM  Result  Value Ref Range   WBC 22.3 (H) 4.0 - 10.5 K/uL   RBC 5.45 (H) 3.87 - 5.11 MIL/uL   Hemoglobin 16.4 (H) 12.0 - 15.0 g/dL   HCT 66.4 40.3 - 47.4 %   MCV 81.8 80.0 - 100.0 fL   MCH 30.1 26.0 - 34.0 pg   MCHC 36.8 (H) 30.0 - 36.0 g/dL   RDW 25.9 56.3 - 87.5 %   Platelets 456 (H) 150 - 400 K/uL   nRBC 0.0 0.0 - 0.2 %   Neutrophils Relative % 68 %   Neutro Abs 15.3 (H) 1.7 - 7.7 K/uL   Lymphocytes Relative 22 %   Lymphs Abs 4.9 (H) 0.7 - 4.0 K/uL   Monocytes Relative 5 %   Monocytes Absolute 1.2 (H) 0.1 - 1.0 K/uL   Eosinophils Relative 3 %   Eosinophils Absolute 0.6 (H) 0.0 - 0.5 K/uL   Basophils Relative 1 %   Basophils Absolute 0.2 (H) 0.0 - 0.1 K/uL   Immature Granulocytes 1 %   Abs Immature Granulocytes 0.12 (H) 0.00 - 0.07 K/uL    Comment: Performed at  [facility], [address] [place], [place] [address]    Blood Alcohol level:  Lab Results  Component Value Date   ETH 219 (H) 03/02/2023   ETH 53 (H) 04/04/2021    Metabolic Disorder Labs:  No results found for: "HGBA1C", "MPG" No results found for: "PROLACTIN" No results found for: "CHOL", "TRIG", "HDL", "CHOLHDL", "VLDL", "LDLCALC"  Current Medications: Current Facility-Administered Medications  Medication Dose Route Frequency Provider Last Rate Last Admin   acetaminophen (TYLENOL) tablet 650 mg  650 mg Oral Q6H PRN [name], [name], NP   650 mg at 03/02/23 2110   alum & mag hydroxide-simeth (MAALOX/MYLANTA) 200-200-20 MG/5ML suspension 30 mL  30 mL Oral Q4H PRN [name], [name], NP       diphenhydrAMINE (BENADRYL) capsule 50 mg  50 mg Oral TID PRN [name], NP       Or   diphenhydrAMINE (BENADRYL) injection 50 mg  50 mg Intramuscular TID PRN [name], [name], NP       feeding supplement (ENSURE ENLIVE / ENSURE PLUS) liquid 237 mL  237 mL Oral BID BM [name], DO   237 mL at 03/03/23 0919   folic acid (FOLVITE) tablet 1 mg  1 mg Oral Daily [name], DO   1 mg at 03/03/23 6045    haloperidol (HALDOL) tablet 5 mg  5 mg Oral TID PRN [name], NP       Or   haloperidol lactate (HALDOL) injection 5 mg  5 mg Intramuscular TID PRN [name], NP       hydrOXYzine (ATARAX) tablet 50 mg  50 mg Oral TID PRN [name], NP   50 mg at 03/03/23 1009   loperamide (IMODIUM) capsule 2-4 mg  2-4 mg Oral PRN [name], DO       LORazepam (ATIVAN) tablet 2 mg  2 mg Oral TID PRN [name], NP       Or   LORazepam (ATIVAN) injection 2 mg  2 mg Intramuscular TID PRN [name], NP       LORazepam (ATIVAN) tablet 1 mg  1 mg Oral Q6H PRN [name], DO       magnesium hydroxide (MILK OF MAGNESIA) suspension 30 mL  30 mL Oral Daily PRN [name], [name], NP       multivitamin with minerals tablet 1 tablet  1 tablet Oral Daily [name], DO   1 tablet at 03/03/23 0917   ondansetron (ZOFRAN-ODT) disintegrating tablet 4 mg  4 mg Oral Q6H PRN [name], DO       thiamine (VITAMIN B1) tablet 100 mg  100 mg Oral Daily [name], DO   100 mg at 03/03/23 4098   traZODone (DESYREL) tablet 50 mg  50 mg Oral QHS PRN [name], NP   50 mg at 03/02/23 2111   PTA Medications: Medications Prior to Admission  Medication Sig Dispense Refill Last Dose   escitalopram (LEXAPRO) 10 MG tablet Take 1 tablet (10 mg total) by mouth daily. 7 tablet 0    escitalopram (LEXAPRO) 10 MG tablet Take 1 tablet (10 mg total) by mouth daily. 30 tablet 1    folic acid (FOLVITE) 1 MG tablet Take 1 tablet (1 mg total) by mouth daily. 7 tablet 0    folic acid (FOLVITE) 1 MG tablet Take 1 tablet (1 mg total) by mouth daily. 30 tablet 0    hydrOXYzine (ATARAX/VISTARIL) 25 MG tablet Take 1 tablet (25 mg  total) by mouth 3 (three) times daily as needed for anxiety. 14 tablet 0    hydrOXYzine (ATARAX/VISTARIL) 25 MG tablet Take 1 tablet (25 mg total) by mouth 3 (three) times daily as needed for anxiety. 30 tablet 0    lisinopril (ZESTRIL) 10 MG tablet Take 1  tablet (10 mg total) by mouth daily. 7 tablet 0    lisinopril (ZESTRIL) 10 MG tablet Take 1 tablet (10 mg total) by mouth daily. 30 tablet 0    thiamine (VITAMIN B-1) 100 MG tablet Take 1 tablet (100 mg total) by mouth daily. 7 tablet 0     Musculoskeletal: Strength & Muscle Tone: within normal limits Gait & Station: normal Patient leans: N/A            Psychiatric Specialty Exam:  Presentation  General Appearance:  Disheveled  Eye Contact: Minimal  Speech: Clear and Coherent  Speech Volume: Decreased  Handedness:No data recorded  Mood and Affect  Mood: Dysphoric  Affect: Congruent   Thought Process  Thought Processes: Coherent  Duration of Psychotic Symptoms:N/A Past Diagnosis of Schizophrenia or Psychoactive disorder: No data recorded Descriptions of Associations:Intact  Orientation:No data recorded Thought Content:Other (comment) (Hallucinations)  Hallucinations:Hallucinations: Auditory Description of Auditory Hallucinations: Repeating her name, calling her ugly...  Ideas of Reference:None  Suicidal Thoughts:Suicidal Thoughts: Yes, Active SI Active Intent and/or Plan: With Intent; With Plan; With Access to Means  Homicidal Thoughts:Homicidal Thoughts: Yes, Passive (Patient states "i want to harm the people in my head") HI Passive Intent and/or Plan: Without Access to Means   Sensorium  Memory: Immediate Good; Recent Good; Remote Good  Judgment: Poor  Insight: Fair   Chartered certified accountant: Fair  Attention Span: Fair  Recall: Good  Fund of Knowledge: Good  Language: Good   Psychomotor Activity  Psychomotor Activity: Psychomotor Activity: Normal   Assets  Assets: Communication Skills; Social Support; Housing   Sleep  Sleep:No data recorded   Physical Exam: Physical Exam Constitutional:      Appearance: Normal appearance.  HENT:     Head: Normocephalic and atraumatic.     Mouth/Throat:      Pharynx: Oropharynx is clear.  Eyes:     Pupils: Pupils are equal, round, and reactive to light.  Cardiovascular:     Rate and Rhythm: Normal rate and regular rhythm.  Pulmonary:     Effort: Pulmonary effort is normal.     Breath sounds: Normal breath sounds.  Abdominal:     General: Abdomen is flat.     Palpations: Abdomen is soft.  Musculoskeletal:        General: Normal range of motion.  Skin:    General: Skin is warm and dry.  Neurological:     General: No focal deficit present.     Mental Status: She is alert. Mental status is at baseline.  Psychiatric:        Attention and Perception: Attention and perception normal.        Mood and Affect: Mood is anxious and depressed. Affect is flat.        Speech: Speech normal.        Behavior: Behavior is cooperative.        Thought Content: Thought content normal.        Cognition and Memory: Cognition and memory normal.        Judgment: Judgment is impulsive.    Review of Systems  Constitutional: Negative.   HENT: Negative.    Eyes: Negative.  Respiratory: Negative.    Cardiovascular: Negative.   Gastrointestinal: Negative.   Genitourinary: Negative.   Musculoskeletal: Negative.   Skin: Negative.   Neurological: Negative.   Endo/Heme/Allergies: Negative.   Psychiatric/Behavioral:  Positive for depression, substance abuse and suicidal ideas.    Blood pressure (!) 147/98, pulse 79, temperature 98.6 F (37 C), temperature source Oral, resp. rate 17, height 5\' 3"  (1.6 m), weight 55.3 kg, SpO2 98 %. Body mass index is 21.61 kg/m.  Treatment Plan Summary: Daily contact with patient to assess and evaluate symptoms and progress in treatment, Medication management, and Plan detox.  See orders.  Observation Level/Precautions:  15 minute checks  Laboratory:  CBC Chemistry Profile  Psychotherapy:    Medications:    Consultations:    Discharge Concerns:    Estimated LOS:  Other:     Physician Treatment Plan for Primary  Diagnosis: Major depressive disorder, single episode, severe without psychotic features (HCC) Long Term Goal(s): Improvement in symptoms so as ready for discharge  Short Term Goals: Ability to identify changes in lifestyle to reduce recurrence of condition will improve, Ability to verbalize feelings will improve, Ability to disclose and discuss suicidal ideas, Ability to demonstrate self-control will improve, Ability to identify and develop effective coping behaviors will improve, Ability to maintain clinical measurements within normal limits will improve, Compliance with prescribed medications will improve, and Ability to identify triggers associated with substance abuse/mental health issues will improve  Physician Treatment Plan for Secondary Diagnosis: Principal Problem:   Major depressive disorder, single episode, severe without psychotic features (HCC) Active Problems:   Suicidal ideation   I certify that inpatient services furnished can reasonably be expected to improve the patient's condition.    [name], DO 6/28/202411:26 AM

## 2023-03-03 NOTE — Progress Notes (Signed)
   03/02/23 2200  Psych Admission Type (Psych Patients Only)  Admission Status Involuntary  Psychosocial Assessment  Patient Complaints Self-harm thoughts;Sleep disturbance  Eye Contact Brief  Facial Expression Flat  Affect Preoccupied  Speech Soft  Interaction Guarded  Motor Activity Slow  Appearance/Hygiene Unremarkable  Behavior Characteristics Appropriate to situation  Mood Depressed;Labile;Preoccupied  Thought Process  Coherency Circumstantial  Content Delusions;Preoccupation  Delusions Paranoid  Perception Hallucinations  Hallucination Auditory  Judgment Impaired  Confusion WDL  Danger to Self  Current suicidal ideation? Denies (Denies)  Agreement Not to Harm Self Yes  Description of Agreement verbal  Danger to Others  Danger to Others None reported or observed   Pt complaint with medication continues to endorse AH denies SI/HI/VH and verbally contracts for safety. No medications adverse effects noted.

## 2023-03-03 NOTE — B[facility] Suicide Risk Assessment (Signed)
[facility] Admission Suicide Risk Assessment   Nursing information obtained from:  Patient Demographic factors:  Low socioeconomic status, Unemployed Current Mental Status:  NA Loss Factors:  Financial problems / change in socioeconomic status Historical Factors:  NA Risk Reduction Factors:  Sense of responsibility to family, Positive coping skills or problem solving skills  Total Time spent with patient: 1 hour Principal Problem: Suicidal ideation Diagnosis:  Principal Problem:   Suicidal ideation  Subjective Data: Isabella C Turneris a 37 y.o. female patient admitted under IVC for suicidal ideation. Per her record, patient was found in the road, holding a knife. Patient states "I want to hurt the people in my head".   HPI:  Isabella Clements is a 37 y.o. female patient admitted under IVC for suicidal ideation. Per her record, patient was found in the road, holding a knife. She denies a prior hx of psychiatric diagnoses, but her medical record indicates MDD, alcohol use disorder, and cocaine abuse. She reports one prior psychiatric hospitalization and suicide attempt, stating she cut her wrist a "couple of years ago". Patient showed heal scar on L wrist.  Medical record indicates a prior psychiatric hospitalization at [facility] from 03/25/21 until 04/08/21, where she presented with SI and picking up a knife, similar to today's presentation. Patient reports drinking a pint of Aristocrat yesterday and smoking crack cocaine. She reports starting to drink alcohol at 37 yo and started smoking crack for the past 3 years. She reports auditory hallucinations that repeat her name, call her ugly, "[place] you stink", "I love you", and voices tell her to eat. She reports visual hallucinations, stating I see things in the sky but is unable to elaborate further. Patient states "I want to hurt the voices in my head". She reports that AVH started after starting crack cocaine, "everybody blames it on crack". Patient does  not believe her hallucinations are induced by crack use. Patient requesting inpatient treatment, stating she wants help. BAC 219, UDS positive for cocaine and cannabinoid. She denies being established with an outpatient psychiatric provider or taking psychiatric medications to manage symptoms.  She states she lives with her mother, [name] and authorized staff to speak with her mother regarding care. 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  Continued Clinical Symptoms:  Alcohol Use Disorder Identification Test Final Score (AUDIT): 19 The "Alcohol Use Disorders Identification Test", Guidelines for Use in Primary Care, Second Edition.  World Science writer [facility]). Score between 0-7:  no or low risk or alcohol related problems. Score between 8-15:  moderate risk of alcohol related problems. Score between 16-19:  high risk of alcohol related problems. Score 20 or above:  warrants further diagnostic evaluation for alcohol dependence and treatment.   CLINICAL FACTORS:   Depression:   Impulsivity Alcohol/Substance Abuse/Dependencies   Musculoskeletal: Strength & Muscle Tone: within normal limits Gait & Station: normal Patient leans: N/A  Psychiatric Specialty Exam:  Presentation  General Appearance:  Disheveled  Eye Contact: Minimal  Speech: Clear and Coherent  Speech Volume: Decreased  Handedness:No data recorded  Mood and Affect  Mood: Dysphoric  Affect: Congruent   Thought Process  Thought Processes: Coherent  Descriptions of Associations:Intact  Orientation:No data recorded Thought Content:Other (comment) (Hallucinations)  History of Schizophrenia/Schizoaffective disorder:No data recorded Duration of Psychotic Symptoms:No data recorded Hallucinations:Hallucinations: Auditory Description of Auditory Hallucinations: Repeating her name, calling her ugly...  Ideas of Reference:None  Suicidal Thoughts:Suicidal Thoughts: Yes,  Active SI Active Intent and/or Plan: With Intent; With  Plan; With Access to Means  Homicidal Thoughts:Homicidal Thoughts: Yes, Passive (Patient states "i want to harm the people in my head") HI Passive Intent and/or Plan: Without Access to Means   Sensorium  Memory: Immediate Good; Recent Good; Remote Good  Judgment: Poor  Insight: Fair   Chartered certified accountant: Fair  Attention Span: Fair  Recall: Good  Fund of Knowledge: Good  Language: Good   Psychomotor Activity  Psychomotor Activity: Psychomotor Activity: Normal   Assets  Assets: Communication Skills; Social Support; Housing   Sleep  Sleep:No data recorded    Blood pressure (!) 147/98, pulse 79, temperature 98.6 F (37 C), temperature source Oral, resp. rate 17, height 5\' 3"  (1.6 m), weight 55.3 kg, SpO2 98 %. Body mass index is 21.61 kg/m.   COGNITIVE FEATURES THAT CONTRIBUTE TO RISK:  None    SUICIDE RISK:   Mild:  Suicidal ideation of limited frequency, intensity, duration, and specificity.  There are no identifiable plans, no associated intent, mild dysphoria and related symptoms, good self-control (both objective and subjective assessment), few other risk factors, and identifiable protective factors, including available and accessible social support.  PLAN OF CARE: See orders  I certify that inpatient services furnished can reasonably be expected to improve the patient's condition.   [name], DO 03/03/2023, 11:19 AM

## 2023-03-03 NOTE — Progress Notes (Signed)
   03/03/23 1800  CIWA-Ar  Nausea and Vomiting 0  Tactile Disturbances 0  Tremor 0  Auditory Disturbances 0  Paroxysmal Sweats 0  Visual Disturbances 0  Anxiety 0  Headache, Fullness in Head 0  Agitation 0  Orientation and Clouding of Sensorium 0  CIWA-Ar Total 0

## 2023-03-03 NOTE — Progress Notes (Signed)
NUTRITION ASSESSMENT  Pt identified as at risk on the Malnutrition Screen Tool  INTERVENTION:  -Ensure Enlive po BID, each supplement provides 350 kcal and 20 grams of protein -Continue MVI with minerals daily -Continue 100 mg thiamine daily -1 mg folic acid daily  NUTRITION DIAGNOSIS: Unintentional weight loss related to sub-optimal intake as evidenced by pt report.   Goal: Pt to meet >/= 90% of their estimated nutrition needs.  Monitor:  PO intake  Assessment:  Pt admitted under IVC for suicidal ideation.   Pt admitted with SI. PMH include MDD, alcohol use disorder, and cocaine abuse.   Pt currently on a regular diet. No meal completion data available to assess at this time. Suspect diet of poor nutritional quality PTA secondary to drug use. Pt would greatly been fit form addition fo roal nutrition supplements.   Noted distant history of weight loss.   Pt with elevated alcohol level. TOx screen positive for cocaine and cannabinoids.   Medications reviewed and include MVI and thiamine.   Labs reviewed: Na: 133, K: 3.1.   37 y.o. female  Height: Ht Readings from Last 1 Encounters:  03/02/23 5\' 3"  (1.6 m)    Weight: Wt Readings from Last 1 Encounters:  03/02/23 55.3 kg    Weight Hx: Wt Readings from Last 10 Encounters:  03/02/23 55.3 kg  04/04/21 63.5 kg  04/04/21 63.5 kg  11/10/17 67.1 kg  08/31/15 77.6 kg  07/27/11 70.3 kg    BMI:  Body mass index is 21.61 kg/m. BMI WDL.   Estimated Nutritional Needs: Kcal: 25-30 kcal/kg Protein: > 1 gram protein/kg Fluid: 1 ml/kcal  Diet Order:  Diet Order             Diet regular Room service appropriate? Yes; Fluid consistency: Thin  Diet effective now                  Pt is also offered choice of unit snacks mid-morning and mid-afternoon.  Pt is eating as desired.   Lab results and medications reviewed.   Levada Schilling, RD, LDN, CDCES Registered Dietitian II Certified Diabetes Care and Education  Specialist Please refer to [facility] for RD and/or RD on-call/weekend/after hours pager

## 2023-03-03 NOTE — Group Note (Signed)
Date:  03/03/2023 Time:  10:02 AM  Group Topic/Focus:  Goals Group:   The focus of this group is to help patients establish daily goals to achieve during treatment and discuss how the patient can incorporate goal setting into their daily lives to aide in recovery.    Participation Level:  Did Not Attend   Isabella Clements [name] 03/03/2023, 10:02 AM  

## 2023-03-03 NOTE — Group Note (Signed)
Recreation Therapy Group Note   Group Topic:Leisure Education  Group Date: 03/03/2023 Start Time: 1000 End Time: 1105 Facilitators: Rosina Lowenstein, LRT, CTRS Location:  Day Room  Group Description: Leisure. Patients were given the option to choose from coloring, singing karaoke, or playing cards. LRT and pts discussed the meaning of leisure, the importance of participating in leisure during their free time/when they're outside of the hospital, as well as how our leisure interests can also serve as coping skills. Pt identified two leisure interests and shared with the group.   Goal Area(s) Addressed:  Patient will identify a current leisure interest.  Patient will learn the definition of "leisure". Patient will practice making a positive decision. Patient will have the opportunity to try a new leisure activity. Patient will communicate with peers and LRT.   Affect/Mood: N/A   Participation Level: Did not attend    Clinical Observations/Individualized Feedback: Patient did not attend group.  Plan: Continue to engage patient in RT group sessions 2-3x/week.   Rosina Lowenstein, LRT, CTRS 03/03/2023 11:51 AM

## 2023-03-03 NOTE — Progress Notes (Signed)
   03/03/23 1200  CIWA-Ar  BP 130/83  Pulse Rate 76  Nausea and Vomiting 0  Tactile Disturbances 0  Tremor 0  Auditory Disturbances 0  Paroxysmal Sweats 0  Visual Disturbances 0  Anxiety 0  Headache, Fullness in Head 0  Agitation 0  Orientation and Clouding of Sensorium 0  CIWA-Ar Total 0
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
## 2023-03-03 NOTE — Plan of Care (Addendum)
D- Patient alert and oriented. Patient presents in a depressed, but pleasant mood on assessment stating that she slept "wonderful" last night and had no complaints to voice to this Clinical research associate. Patient denies anxiety, but endorses "a little bit" of depression, stating "not feeling like I belong". Patient also denies SI, HI, AVH, and pain at this time, stating that she is "ok". Patient had no stated goals for today.  A- Scheduled medications administered to patient, per MD orders. Support and encouragement provided.  Routine safety checks conducted every 15 minutes.  Patient informed to notify staff with problems or concerns.  R- No adverse drug reactions noted. Patient contracts for safety at this time. Patient compliant with medications. Patient receptive, calm, and cooperative. Patient isolates to room, except for meals. Patient remains safe at this time.  Problem: Education: Goal: Knowledge of General Education information will improve Description: Including pain rating scale, medication(s)/side effects and non-pharmacologic comfort measures 03/03/2023 1102 by Doyce Para, RN Outcome: Not Progressing 03/03/2023 1102 by Doyce Para, RN Outcome: Not Progressing   Problem: Health Behavior/Discharge Planning: Goal: Ability to manage health-related needs will improve 03/03/2023 1102 by Doyce Para, RN Outcome: Not Progressing 03/03/2023 1102 by Doyce Para, RN Outcome: Not Progressing   Problem: Clinical Measurements: Goal: Ability to maintain clinical measurements within normal limits will improve 03/03/2023 1102 by Doyce Para, RN Outcome: Not Progressing 03/03/2023 1102 by Doyce Para, RN Outcome: Not Progressing Goal: Will remain free from infection 03/03/2023 1102 by Doyce Para, RN Outcome: Not Progressing 03/03/2023 1102 by Doyce Para, RN Outcome: Not Progressing Goal: Diagnostic test results will improve 03/03/2023 1102 by Doyce Para, RN Outcome: Not Progressing 03/03/2023 1102 by Doyce Para, RN Outcome: Not Progressing Goal: Respiratory complications will improve 03/03/2023 1102 by Doyce Para, RN Outcome: Not Progressing 03/03/2023 1102 by Doyce Para, RN Outcome: Not Progressing Goal: Cardiovascular complication will be avoided 03/03/2023 1102 by Doyce Para, RN Outcome: Not Progressing 03/03/2023 1102 by Doyce Para, RN Outcome: Not Progressing   Problem: Activity: Goal: Risk for activity intolerance will decrease 03/03/2023 1102 by Doyce Para, RN Outcome: Not Progressing 03/03/2023 1102 by Doyce Para, RN Outcome: Not Progressing   Problem: Nutrition: Goal: Adequate nutrition will be maintained 03/03/2023 1102 by Doyce Para, RN Outcome: Not Progressing 03/03/2023 1102 by Doyce Para, RN Outcome: Not Progressing   Problem: Coping: Goal: Level of anxiety will decrease 03/03/2023 1102 by Doyce Para, RN Outcome: Not Progressing 03/03/2023 1102 by Doyce Para, RN Outcome: Not Progressing   Problem: Elimination: Goal: Will not experience complications related to bowel motility 03/03/2023 1102 by Doyce Para, RN Outcome: Not Progressing 03/03/2023 1102 by Doyce Para, RN Outcome: Not Progressing Goal: Will not experience complications related to urinary retention 03/03/2023 1102 by Doyce Para, RN Outcome: Not Progressing 03/03/2023 1102 by Doyce Para, RN Outcome: Not Progressing   Problem: Pain Managment: Goal: General experience of comfort will improve 03/03/2023 1102 by Doyce Para, RN Outcome: Not Progressing 03/03/2023 1102 by Doyce Para, RN Outcome: Not Progressing   Problem: Safety: Goal: Ability to remain free from injury will improve 03/03/2023 1102 by Doyce Para, RN Outcome: Not Progressing 03/03/2023 1102 by Doyce Para, RN Outcome: Not Progressing    Problem: Skin Integrity: Goal: Risk for impaired skin integrity will decrease 03/03/2023 1102 by Doyce Para, RN Outcome: Not Progressing 03/03/2023 1102 by Doyce Para, RN Outcome: Not Progressing   Problem: Education: Goal: Knowledge of disease or condition will improve 03/03/2023 1102 by Doyce Para, RN Outcome: Not Progressing 03/03/2023 1102 by  Doyce Para, RN Outcome: Not Progressing Goal: Understanding of discharge needs will improve 03/03/2023 1102 by Doyce Para, RN Outcome: Not Progressing 03/03/2023 1102 by Doyce Para, RN Outcome: Not Progressing   Problem: Health Behavior/Discharge Planning: Goal: Ability to identify changes in lifestyle to reduce recurrence of condition will improve 03/03/2023 1102 by Doyce Para, RN Outcome: Not Progressing 03/03/2023 1102 by Doyce Para, RN Outcome: Not Progressing Goal: Identification of resources available to assist in meeting health care needs will improve 03/03/2023 1102 by Doyce Para, RN Outcome: Not Progressing 03/03/2023 1102 by Doyce Para, RN Outcome: Not Progressing   Problem: Physical Regulation: Goal: Complications related to the disease process, condition or treatment will be avoided or minimized 03/03/2023 1102 by Doyce Para, RN Outcome: Not Progressing 03/03/2023 1102 by Doyce Para, RN Outcome: Not Progressing   Problem: Safety: Goal: Ability to remain free from injury will improve 03/03/2023 1102 by Doyce Para, RN Outcome: Not Progressing 03/03/2023 1102 by Doyce Para, RN Outcome: Not Progressing   Problem: Education: Goal: Ability to make informed decisions regarding treatment will improve 03/03/2023 1102 by Doyce Para, RN Outcome: Not Progressing 03/03/2023 1102 by Doyce Para, RN Outcome: Not Progressing   Problem: Coping: Goal: Coping ability will improve 03/03/2023 1102 by Doyce Para,  RN Outcome: Not Progressing 03/03/2023 1102 by Doyce Para, RN Outcome: Not Progressing   Problem: Health Behavior/Discharge Planning: Goal: Identification of resources available to assist in meeting health care needs will improve Outcome: Not Progressing   Problem: Medication: Goal: Compliance with prescribed medication regimen will improve Outcome: Not Progressing   Problem: Self-Concept: Goal: Ability to disclose and discuss suicidal ideas will improve Outcome: Not Progressing Goal: Will verbalize positive feelings about self Outcome: Not Progressing

## 2023-03-04 MED ORDER — DIAZEPAM 2 MG PO TABS: 2.0000 mg | ORAL_TABLET | Freq: Every day | ORAL | Status: DC

## 2023-03-04 NOTE — Plan of Care (Signed)
  Problem: Education: Goal: Knowledge of General Education information will improve Description: Including pain rating scale, medication(s)/side effects and non-pharmacologic comfort measures Outcome: Not Progressing   Problem: Health Behavior/Discharge Planning: Goal: Ability to manage health-related needs will improve Outcome: Not Progressing   Problem: Clinical Measurements: Goal: Ability to maintain clinical measurements within normal limits will improve Outcome: Not Progressing Goal: Will remain free from infection Outcome: Not Progressing Goal: Diagnostic test results will improve Outcome: Not Progressing Goal: Respiratory complications will improve Outcome: Not Progressing Goal: Cardiovascular complication will be avoided Outcome: Not Progressing   Problem: Activity: Goal: Risk for activity intolerance will decrease Outcome: Not Progressing   Problem: Nutrition: Goal: Adequate nutrition will be maintained Outcome: Not Progressing   Problem: Coping: Goal: Level of anxiety will decrease Outcome: Not Progressing   Problem: Elimination: Goal: Will not experience complications related to bowel motility Outcome: Not Progressing Goal: Will not experience complications related to urinary retention Outcome: Not Progressing   Problem: Pain Managment: Goal: General experience of comfort will improve Outcome: Not Progressing   Problem: Safety: Goal: Ability to remain free from injury will improve Outcome: Not Progressing   Problem: Skin Integrity: Goal: Risk for impaired skin integrity will decrease Outcome: Not Progressing   Problem: Education: Goal: Knowledge of disease or condition will improve Outcome: Not Progressing Goal: Understanding of discharge needs will improve Outcome: Not Progressing   Problem: Health Behavior/Discharge Planning: Goal: Ability to identify changes in lifestyle to reduce recurrence of condition will improve Outcome: Not  Progressing Goal: Identification of resources available to assist in meeting health care needs will improve Outcome: Not Progressing   Problem: Physical Regulation: Goal: Complications related to the disease process, condition or treatment will be avoided or minimized Outcome: Not Progressing   Problem: Safety: Goal: Ability to remain free from injury will improve Outcome: Not Progressing   Problem: Education: Goal: Ability to make informed decisions regarding treatment will improve Outcome: Not Progressing   Problem: Coping: Goal: Coping ability will improve Outcome: Not Progressing   Problem: Health Behavior/Discharge Planning: Goal: Identification of resources available to assist in meeting health care needs will improve Outcome: Not Progressing   Problem: Medication: Goal: Compliance with prescribed medication regimen will improve Outcome: Not Progressing   Problem: Self-Concept: Goal: Ability to disclose and discuss suicidal ideas will improve Outcome: Not Progressing Goal: Will verbalize positive feelings about self Outcome: Not Progressing

## 2023-03-04 NOTE — Group Note (Signed)
Date:  03/04/2023 Time:  10:53 AM  Group Topic/Focus:  Music Therapy- Outside group time, working and building team skills by playing basketball  and other group activities.    Participation Level:  Did Not Attend   Doug Sou 03/04/2023, 10:53 AM

## 2023-03-04 NOTE — [facility] Suicide Risk Assessment (Signed)
[facility] INPATIENT:  Family/Significant Other Suicide Prevention Education  Suicide Prevention Education:  Education Completed; Aaniya Maczka, [phone],  has been identified by the patient as the family member/significant other with whom the patient will be residing, and identified as the person(s) who will aid the patient in the event of a mental health crisis (suicidal ideations/suicide attempt).  With written consent from the patient, the family member/significant other has been provided the following suicide prevention education, prior to the and/or following the discharge of the patient.  The suicide prevention education provided includes the following: Suicide risk factors Suicide prevention and interventions National Suicide Hotline telephone number [facility] assessment telephone number [facility] Emergency Assistance 911 [facility] and/or Residential Mobile Crisis Unit telephone number  Request made of family/significant other to: Remove weapons (e.g., guns, rifles, knives), all items previously/currently identified as safety concern.   Remove drugs/medications (over-the-counter, prescriptions, illicit drugs), all items previously/currently identified as a safety concern.  The family member/significant other verbalizes understanding of the suicide prevention education information provided.  The family member/significant other agrees to remove the items of safety concern listed above.  The LCSWA contacted the patients mother to provide suicide prevention education. The patients mother stated that she did not have any concerns regarding the patient being discharged. Stated that she didn't notice any changes in the patients behavior. The mother stated that there are no guns or weapons in the home. The LCSWA suggested that they remove sharp objects or knives and high volumes of medication. The mother stated that she would take care of it. The LCSWA also suggested that  there be someone to check in on the patient and be that support once discharged.    Marshell Levan 03/04/2023, 3:08 PM

## 2023-03-04 NOTE — Progress Notes (Signed)
D- Patient alert and orientated x 2-3. Affect flat and drowsy/mood depressed. Denies SI/ HI. Endorses AVH- states the "voices are nice today". She denies pain. Patient endorses depression and anxiety. She did not participate in any activities today. A- Scheduled medications administered to patient, per MD orders. Support and encouragement provided.  Routine safety checks conducted every 15 minutes without incident.  Patient informed to notify staff with problems or concerns and verbalizes understanding. R- No adverse drug reactions noted.  Patient compliant with medications and treatment plan. Patient drowsy calm cooperative. She is isolative to her room except for meals  Patient contracts for safety and  remains safe on the unit at this time.

## 2023-03-04 NOTE — Progress Notes (Signed)
The patient reported an improvement in her mood and overall functioning.  Symptoms of withdrawal not endorsed or observed.  Isabella Clements denied thoughts of self-harm and was pleasant upon contact.  Will continue to monitor for problematic symptoms of withdrawal.

## 2023-03-04 NOTE — Group Note (Signed)
LCSW Group Therapy Note  Group Date: 03/04/2023 Start Time: 1315 End Time: 1355   Type of Therapy and Topic:  Group Therapy - Coping Skills  Participation Level:  Did Not Attend   Description of Group The focus of this group was to determine what unhealthy coping techniques typically are used by group members and what healthy coping techniques would be helpful in coping with various problems. Patients were guided in becoming aware of the differences between healthy and unhealthy coping techniques. Patients were asked to identify 2-3 healthy coping skills they would like to learn to use more effectively.  Therapeutic Goals Patients learned that coping is what human beings do all day long to deal with various situations in their lives Patients defined and discussed healthy vs unhealthy coping techniques Patients identified their preferred coping techniques and identified whether these were healthy or unhealthy Patients determined 2-3 healthy coping skills they would like to become more familiar with and use more often. Patients provided support and ideas to each other   Summary of Patient Progress:  The patient did not attend group.   Marshell Levan, LCSWA 03/04/2023  2:33 PM

## 2023-03-04 NOTE — Progress Notes (Signed)
Isabella Clements remained in bed for the evening, but awakened to eat a snack and to drink fluids.  She was encouraged to seek help from her treatment team should the need arise.  The patient was pleasant upon contact, with a depressed mood.  She denied problematic symptoms of withdrawal.  No unsafe behaviors noted.  CIWA's and 15-minute safety monitoring will continue.

## 2023-03-04 NOTE — Progress Notes (Signed)
[facility] MD Progress Note  03/04/2023 2:24 PM Isabella Clements  MRN:  [id number] Subjective: Isabella Clements is seen on rounds.  She states that she slept well last night.  She states that she is feeling better.  She denies any suicidal ideation.  No withdrawal symptoms.  May decrease her Valium.  Nurses report no issues. Principal Problem: Major depressive disorder, single episode, severe without psychotic features (HCC) Diagnosis: Principal Problem:   Major depressive disorder, single episode, severe without psychotic features (HCC) Active Problems:   Suicidal ideation  Total Time spent with patient: 15 minutes  Past Psychiatric History: Depression and polysubstance abuse  Past Medical History: History reviewed. No pertinent past medical history. History reviewed. No pertinent surgical history. Family History: History reviewed. No pertinent family history. Family Psychiatric  History: Unremarkable Social History:  Social History   Substance and Sexual Activity  Alcohol Use Yes   Alcohol/week: 7.0 standard drinks of alcohol   Types: 7 Cans of beer per week   Comment: pt states, "a beer a day"     Social History   Substance and Sexual Activity  Drug Use Yes   Types: Cocaine, "Crack" cocaine    Social History   Socioeconomic History   Marital status: Single    Spouse name: Not on file   Number of children: Not on file   Years of education: Not on file   Highest education level: Not on file  Occupational History   Not on file  Tobacco Use   Smoking status: Every Day    Packs/day: .5    Types: Cigarettes   Smokeless tobacco: Never  Vaping Use   Vaping Use: Not on file  Substance and Sexual Activity   Alcohol use: Yes    Alcohol/week: 7.0 standard drinks of alcohol    Types: 7 Cans of beer per week    Comment: pt states, "a beer a day"   Drug use: Yes    Types: Cocaine, "Crack" cocaine   Sexual activity: Yes    Birth control/protection: None  Other Topics Concern    Not on file  Social History Narrative   Not on file   Social Determinants of Health   Financial Resource Strain: Not on file  Food Insecurity: Food Insecurity Present (03/02/2023)   Hunger Vital Sign    Worried About Running Out of Food in the Last Year: Sometimes true    Ran Out of Food in the Last Year: Sometimes true  Transportation Needs: No Transportation Needs (03/02/2023)   PRAPARE - Administrator, Civil Service (Medical): No    Lack of Transportation (Non-Medical): No  Physical Activity: Not on file  Stress: Not on file  Social Connections: Not on file   Additional Social History:                         Sleep: Good  Appetite:  Good  Current Medications: Current Facility-Administered Medications  Medication Dose Route Frequency Provider Last Rate Last Admin   acetaminophen (TYLENOL) tablet 650 mg  650 mg Oral Q6H PRN [name], [name], NP   650 mg at 03/02/23 2110   alum & mag hydroxide-simeth (MAALOX/MYLANTA) 200-200-20 MG/5ML suspension 30 mL  30 mL Oral Q4H PRN [name], [name], NP       [START ON 03/05/2023] diazepam (VALIUM) tablet 2 mg  2 mg Oral QHS Astryd Pearcy Edward, DO       diphenhydrAMINE (BENADRYL) capsule 50 mg  50  mg Oral TID PRN [name], NP       Or   diphenhydrAMINE (BENADRYL) injection 50 mg  50 mg Intramuscular TID PRN [name], [name], NP       feeding supplement (ENSURE ENLIVE / ENSURE PLUS) liquid 237 mL  237 mL Oral BID BM [name], DO   237 mL at 03/04/23 0820   FLUoxetine (PROZAC) capsule 20 mg  20 mg Oral Daily [name], DO   20 mg at 03/04/23 0865   folic acid (FOLVITE) tablet 1 mg  1 mg Oral Daily [name], DO   1 mg at 03/04/23 7846   haloperidol (HALDOL) tablet 5 mg  5 mg Oral TID PRN [name], NP       Or   haloperidol lactate (HALDOL) injection 5 mg  5 mg Intramuscular TID PRN [name], NP       hydrOXYzine (ATARAX) tablet 50 mg  50 mg Oral TID PRN [name], NP    50 mg at 03/03/23 1009   loperamide (IMODIUM) capsule 2-4 mg  2-4 mg Oral PRN [name], DO       LORazepam (ATIVAN) tablet 2 mg  2 mg Oral TID PRN [name], NP       Or   LORazepam (ATIVAN) injection 2 mg  2 mg Intramuscular TID PRN [name], NP       LORazepam (ATIVAN) tablet 1 mg  1 mg Oral Q6H PRN [name], DO       magnesium hydroxide (MILK OF MAGNESIA) suspension 30 mL  30 mL Oral Daily PRN [name], [name], NP       multivitamin with minerals tablet 1 tablet  1 tablet Oral Daily [name], DO   1 tablet at 03/04/23 0820   ondansetron (ZOFRAN-ODT) disintegrating tablet 4 mg  4 mg Oral Q6H PRN [name], DO       risperiDONE (RISPERDAL) tablet 0.5 mg  0.5 mg Oral BH-[id number] [name], DO   0.5 mg at 03/04/23 9629   thiamine (VITAMIN B1) tablet 100 mg  100 mg Oral Daily [name], DO   100 mg at 03/04/23 5284   traZODone (DESYREL) tablet 50 mg  50 mg Oral QHS PRN [name], NP   50 mg at 03/02/23 2111    Lab Results: No results found for this or any previous visit (from the past 48 hour(s)).  Blood Alcohol level:  Lab Results  Component Value Date   ETH 219 (H) 03/02/2023   ETH 53 (H) 04/04/2021    Metabolic Disorder Labs: No results found for: "HGBA1C", "MPG" No results found for: "PROLACTIN" No results found for: "CHOL", "TRIG", "HDL", "CHOLHDL", "VLDL", "LDLCALC"  Physical Findings: AIMS:  , ,  ,  ,    CIWA:  CIWA-Ar Total: 7 COWS:     Musculoskeletal: Strength & Muscle Tone: within normal limits Gait & Station: normal Patient leans: N/A  Psychiatric Specialty Exam:  Presentation  General Appearance:  Disheveled  Eye Contact: Minimal  Speech: Clear and Coherent  Speech Volume: Decreased  Handedness:No data recorded  Mood and Affect  Mood: Dysphoric  Affect: Congruent   Thought Process  Thought Processes: Coherent  Descriptions of  Associations:Intact  Orientation:No data recorded Thought Content:Other (comment) (Hallucinations)  History of Schizophrenia/Schizoaffective disorder:No data recorded Duration of Psychotic Symptoms:No data recorded Hallucinations:No data recorded Ideas of Reference:None  Suicidal Thoughts:No data recorded Homicidal Thoughts:No data recorded  Sensorium  Memory:  Immediate Good; Recent Good; Remote Good  Judgment: Poor  Insight: Fair   Chartered certified accountant: Fair  Attention Span: Fair  Recall: Dudley Major of Knowledge: Good  Language: Good   Psychomotor Activity  Psychomotor Activity:No data recorded  Assets  Assets: Communication Skills; Social Support; Housing   Sleep  Sleep:No data recorded   Blood pressure (!) 149/101, pulse 69, temperature 98.1 F (36.7 C), temperature source Oral, resp. rate 20, height 5\' 3"  (1.6 m), weight 55.3 kg, SpO2 100 %. Body mass index is 21.61 kg/m.   Treatment Plan Summary: Daily contact with patient to assess and evaluate symptoms and progress in treatment, Medication management, and Plan decrease Valium to 2 mg once a day.  [name], DO 03/04/2023, 2:24 PM

## 2023-03-04 NOTE — Group Note (Signed)
Date:  03/04/2023 Time:  6:41 PM  Group Topic/Focus:  Self Care:   The focus of this group is to help patients understand the importance of self-care in order to improve or restore emotional, physical, spiritual, interpersonal, and financial health. Gratitude assertiveness.     Participation Level:  Did Not Attend   Doug Sou 03/04/2023, 6:41 PM

## 2023-03-05 NOTE — Progress Notes (Signed)
[name] MD Progress Note  03/05/2023 2:14 PM EZELLE Clements  MRN:  [id number] Subjective: Isabella Clements is seen on rounds.  She states that she is feeling better.  She denies any side effects from her medications.  Vital signs are stable.  Nurses report no issues. Principal Problem: Major depressive disorder, single episode, severe without psychotic features (HCC) Diagnosis: Principal Problem:   Major depressive disorder, single episode, severe without psychotic features (HCC) Active Problems:   Suicidal ideation  Total Time spent with patient: 15 minutes  Past Psychiatric History: Depression.  Past Medical History: History reviewed. No pertinent past medical history. History reviewed. No pertinent surgical history. Family History: History reviewed. No pertinent family history. Family Psychiatric  History: Unremarkable Social History:  Social History   Substance and Sexual Activity  Alcohol Use Yes   Alcohol/week: 7.0 standard drinks of alcohol   Types: 7 Cans of beer per week   Comment: pt states, "a beer a day"     Social History   Substance and Sexual Activity  Drug Use Yes   Types: Cocaine, "Crack" cocaine    Social History   Socioeconomic History   Marital status: Single    Spouse name: Not on file   Number of children: Not on file   Years of education: Not on file   Highest education level: Not on file  Occupational History   Not on file  Tobacco Use   Smoking status: Every Day    Packs/day: .5    Types: Cigarettes   Smokeless tobacco: Never  Vaping Use   Vaping Use: Not on file  Substance and Sexual Activity   Alcohol use: Yes    Alcohol/week: 7.0 standard drinks of alcohol    Types: 7 Cans of beer per week    Comment: pt states, "a beer a day"   Drug use: Yes    Types: Cocaine, "Crack" cocaine   Sexual activity: Yes    Birth control/protection: None  Other Topics Concern   Not on file  Social History Narrative   Not on file   Social Determinants of  Health   Financial Resource Strain: Not on file  Food Insecurity: Food Insecurity Present (03/02/2023)   Hunger Vital Sign    Worried About Running Out of Food in the Last Year: Sometimes true    Ran Out of Food in the Last Year: Sometimes true  Transportation Needs: No Transportation Needs (03/02/2023)   PRAPARE - Administrator, Civil Service (Medical): No    Lack of Transportation (Non-Medical): No  Physical Activity: Not on file  Stress: Not on file  Social Connections: Not on file   Additional Social History:                         Sleep: Good  Appetite:  Good  Current Medications: Current Facility-Administered Medications  Medication Dose Route Frequency Provider Last Rate Last Admin   acetaminophen (TYLENOL) tablet 650 mg  650 mg Oral Q6H PRN [name], [name], NP   650 mg at 03/02/23 2110   alum & mag hydroxide-simeth (MAALOX/MYLANTA) 200-200-20 MG/5ML suspension 30 mL  30 mL Oral Q4H PRN [name], [name], NP       diphenhydrAMINE (BENADRYL) capsule 50 mg  50 mg Oral TID PRN [name], [name], NP       Or   diphenhydrAMINE (BENADRYL) injection 50 mg  50 mg Intramuscular TID PRN [name], NP       feeding  supplement (ENSURE ENLIVE / ENSURE PLUS) liquid 237 mL  237 mL Oral BID BM [name], DO   237 mL at 03/05/23 1026   FLUoxetine (PROZAC) capsule 20 mg  20 mg Oral Daily [name], DO   20 mg at 03/05/23 1610   folic acid (FOLVITE) tablet 1 mg  1 mg Oral Daily [name], DO   1 mg at 03/05/23 9604   haloperidol (HALDOL) tablet 5 mg  5 mg Oral TID PRN [name], NP       Or   haloperidol lactate (HALDOL) injection 5 mg  5 mg Intramuscular TID PRN [name], NP       hydrOXYzine (ATARAX) tablet 50 mg  50 mg Oral TID PRN [name], NP   50 mg at 03/03/23 1009   loperamide (IMODIUM) capsule 2-4 mg  2-4 mg Oral PRN [name], DO       LORazepam (ATIVAN) tablet 2 mg  2 mg Oral TID PRN [name], NP        Or   LORazepam (ATIVAN) injection 2 mg  2 mg Intramuscular TID PRN [name], NP       LORazepam (ATIVAN) tablet 1 mg  1 mg Oral Q6H PRN [name], DO       magnesium hydroxide (MILK OF MAGNESIA) suspension 30 mL  30 mL Oral Daily PRN [name], [name], NP       multivitamin with minerals tablet 1 tablet  1 tablet Oral Daily [name], DO   1 tablet at 03/05/23 0842   ondansetron (ZOFRAN-ODT) disintegrating tablet 4 mg  4 mg Oral Q6H PRN [name], DO       risperiDONE (RISPERDAL) tablet 0.5 mg  0.5 mg Oral BH-[id number] [name], DO   0.5 mg at 03/05/23 5409   thiamine (VITAMIN B1) tablet 100 mg  100 mg Oral Daily [name], DO   100 mg at 03/05/23 8119   traZODone (DESYREL) tablet 50 mg  50 mg Oral QHS PRN [name], NP   50 mg at 03/04/23 2103    Lab Results: No results found for this or any previous visit (from the past 48 hour(s)).  Blood Alcohol level:  Lab Results  Component Value Date   ETH 219 (H) 03/02/2023   ETH 53 (H) 04/04/2021    Metabolic Disorder Labs: No results found for: "HGBA1C", "MPG" No results found for: "PROLACTIN" No results found for: "CHOL", "TRIG", "HDL", "CHOLHDL", "VLDL", "LDLCALC"  Physical Findings: AIMS:  , ,  ,  ,    CIWA:  CIWA-Ar Total: 0 COWS:     Musculoskeletal: Strength & Muscle Tone: within normal limits Gait & Station: normal Patient leans: N/A  Psychiatric Specialty Exam:  Presentation  General Appearance:  Disheveled  Eye Contact: Minimal  Speech: Clear and Coherent  Speech Volume: Decreased  Handedness:No data recorded  Mood and Affect  Mood: Dysphoric  Affect: Congruent   Thought Process  Thought Processes: Coherent  Descriptions of Associations:Intact  Orientation:No data recorded Thought Content:Other (comment) (Hallucinations)  History of Schizophrenia/Schizoaffective disorder:No data recorded Duration of Psychotic Symptoms:No  data recorded Hallucinations:No data recorded Ideas of Reference:None  Suicidal Thoughts:No data recorded Homicidal Thoughts:No data recorded  Sensorium  Memory: Immediate Good; Recent Good; Remote Good  Judgment: Poor  Insight: Fair   Art therapist  Concentration: Fair  Attention Span: Fair  Recall: Good  Fund of Knowledge: Good  Language: Good   Psychomotor  Activity  Psychomotor Activity:No data recorded  Assets  Assets: Communication Skills; Social Support; Housing   Sleep  Sleep:No data recorded    Blood pressure 132/85, pulse 81, temperature 98.1 F (36.7 C), temperature source Oral, resp. rate (!) 22, height 5\' 3"  (1.6 m), weight 55.3 kg, SpO2 100 %. Body mass index is 21.61 kg/m.   Treatment Plan Summary: Daily contact with patient to assess and evaluate symptoms and progress in treatment, Medication management, and Plan discontinue Valium.  [name], DO 03/05/2023, 2:14 PM

## 2023-03-05 NOTE — Group Note (Signed)
Date:  03/05/2023 Time:  10:09 AM  Group Topic/Focus:  Activity Group:  The focus of this group is to encourage patients to come outside to the courtyard to assist with their physical and mental wellbeing.    Participation Level:  Did Not Attend   Isabella Clements 03/05/2023, 10:09 AM

## 2023-03-05 NOTE — Group Note (Signed)
Date:  03/05/2023 Time:  3:50 PM  Group Topic/Focus:  Activity Group:  The purpose of this group is to encourage patients to come outside to the courtyard to get some fresh air for the support of their physical and mental wellbeing.    Participation Level:  Did Not Attend   Isabella Clements 03/05/2023, 3:50 PM

## 2023-03-05 NOTE — Progress Notes (Signed)
   03/05/23 0840  Psych Admission Type (Psych Patients Only)  Admission Status Involuntary  Psychosocial Assessment  Patient Complaints Depression  Eye Contact Fair  Facial Expression Animated  Affect Appropriate to circumstance  Speech Soft  Interaction Isolative  Motor Activity Slow  Appearance/Hygiene Unremarkable  Behavior Characteristics Cooperative  Mood Depressed  Thought Process  Coherency WDL  Content WDL  Delusions None reported or observed  Perception Hallucinations  Hallucination Auditory  Judgment Impaired  Confusion None  Danger to Self  Current suicidal ideation? Denies  Agreement Not to Harm Self Yes  Danger to Others  Danger to Others None reported or observed   Pt is appropriate. Pt is out in milieu with peers. Pt adheres to scheduled medications. No signs of distress or injury. Staff will continue to monitor q15 for safety.

## 2023-03-05 NOTE — Plan of Care (Signed)

## 2023-03-06 MED ORDER — TRAZODONE HCL 100 MG PO TABS
100.0000 mg | ORAL_TABLET | Freq: Every evening | ORAL | Status: DC | PRN
  Administered 2023-03-06: 100 mg via ORAL
  Filled 2023-03-06: qty 1

## 2023-03-06 NOTE — Group Note (Signed)
Recreation Therapy Group Note   Group Topic:Goal Setting  Group Date: 03/06/2023 Start Time: 1000 End Time: 1115 Facilitators: Clinton Gallant, CTRS Location:  Craft Room  Group Description: Vision Board. Patients were given many different magazines, a glue stick, markers, and a piece of cardstock paper. LRT and pts discussed the importance of having goals in life. LRT and pts discussed the difference between short-term and long-term goals, as well as what a SMART goal is. LRT encouraged pts to create a vision board, with images they picked and then cut out with safety scissors from the magazine, for themselves, that capture their short and long-term goals. LRT encouraged pts to show and explain their vision board to the group. LRT offered to laminate vision board once dry and complete.   Goal Area(s) Addressed:  Patient will gain knowledge of short vs. long term goals.  Patient will identify goals for themselves. Patient will practice setting SMART goals. Patient will verbalize their goals to LRT and peers.  Affect/Mood: N/A   Participation Level: Did not attend    Clinical Observations/Individualized Feedback: Patient did not attend group.  Plan: Continue to engage patient in RT group sessions 2-3x/week.   Rosina Lowenstein, LRT, CTRS 03/06/2023 11:50 AM

## 2023-03-06 NOTE — Progress Notes (Signed)
The patient was calm, cooperative, and pleasant during the evening.  She was administered PRN trazodone before bedtime to help with sleep.  Mood and affect were stable/euthymic.  Symptoms of withdrawal denied.  Isabella Clements made numerous phone calls and stated that she was interested in discharge once she meets with her treatment team.  No issues to note.

## 2023-03-06 NOTE — Group Note (Signed)
[facility] LCSW Group Therapy Note    Group Date: 03/06/2023 Start Time: 1300 End Time: 1350  Type of Therapy and Topic:  Group Therapy:  Overcoming Obstacles  Participation Level:  [facility] PARTICIPATION LEVEL: Did Not Attend   Description of Group:   In this group patients will be encouraged to explore what they see as obstacles to their own wellness and recovery. They will be guided to discuss their thoughts, feelings, and behaviors related to these obstacles. The group will process together ways to cope with barriers, with attention given to specific choices patients can make. Each patient will be challenged to identify changes they are motivated to make in order to overcome their obstacles. This group will be process-oriented, with patients participating in exploration of their own experiences as well as giving and receiving support and challenge from other group members.  Therapeutic Goals: 1. Patient will identify personal and current obstacles as they relate to admission. 2. Patient will identify barriers that currently interfere with their wellness or overcoming obstacles.  3. Patient will identify feelings, thought process and behaviors related to these barriers. 4. Patient will identify two changes they are willing to make to overcome these obstacles:    Summary of Patient Progress X   Therapeutic Modalities:   Cognitive Behavioral Therapy Solution Focused Therapy Motivational Interviewing Relapse Prevention Therapy   Song Myre R Alga Southall, LCSW 

## 2023-03-06 NOTE — Group Note (Signed)
Date:  03/06/2023 Time:  9:00 AM  Group Topic/Focus:  Goals Group:   The focus of this group is to help patients establish daily goals to achieve during treatment and discuss how the patient can incorporate goal setting into their daily lives to aide in recovery.  Community Group   Participation Level:  Did Not Attend  Isabella Clements A Isabella Clements 03/06/2023, 5:38 PM

## 2023-03-06 NOTE — Progress Notes (Signed)
Patient presents pleasant and cooperative. Appropriate with staff and peers. Noted socializing in dayroom with peer, braiding peers hair. Denies Si, HI, AVH. Currently denies all, endorses having bad dreams. Patient request prn for sleep, given with good relief thus far.  Encouragement and support provided. Safety checks maintained. Medications given as prescribed. Pt receptive and remains safe on unit with q 15 min checks.

## 2023-03-06 NOTE — Progress Notes (Signed)
Patient ID: Isabella Clements, female   DOB: [birth year], 37 y.o.   MRN: [id number]  [facility] MD Progress Note  03/06/2023 1:32 PM JAYLIANIE ROSIAK  MRN:  [id number]  Subjective: Pt seen on rounds. She reports she is "doing a whole lot better". Has been tolerating medications without issues. Feels current medications are helpful. Denies SI/HI AVH, paranoia. Her only complaint today is sleep. Pt currently prescribed trazodone 50mg  oral at bedtime prn sleep. Feels this is somewhat helpful for her. Discussed increasing trazodone to 100mg . She is in agreement. No evidence of agitation, aggression, distractibility or internal preoccupation. No delusions or paranoia elicited. Pt is calm, cooperative, pleasant.   Principal Problem: Major depressive disorder, single episode, severe without psychotic features (HCC) Diagnosis: Principal Problem:   Major depressive disorder, single episode, severe without psychotic features (HCC) Active Problems:   Suicidal ideation  Total Time spent with patient: 15 minutes  Past Psychiatric History: Depression.  Past Medical History: History reviewed. No pertinent past medical history. History reviewed. No pertinent surgical history. Family History: History reviewed. No pertinent family history. Family Psychiatric  History: Unremarkable Social History:  Social History   Substance and Sexual Activity  Alcohol Use Yes   Alcohol/week: 7.0 standard drinks of alcohol   Types: 7 Cans of beer per week   Comment: pt states, "a beer a day"     Social History   Substance and Sexual Activity  Drug Use Yes   Types: Cocaine, "Crack" cocaine    Social History   Socioeconomic History   Marital status: Single    Spouse name: Not on file   Number of children: Not on file   Years of education: Not on file   Highest education level: Not on file  Occupational History   Not on file  Tobacco Use   Smoking status: Every Day    Packs/day: .5    Types: Cigarettes    Smokeless tobacco: Never  Vaping Use   Vaping Use: Not on file  Substance and Sexual Activity   Alcohol use: Yes    Alcohol/week: 7.0 standard drinks of alcohol    Types: 7 Cans of beer per week    Comment: pt states, "a beer a day"   Drug use: Yes    Types: Cocaine, "Crack" cocaine   Sexual activity: Yes    Birth control/protection: None  Other Topics Concern   Not on file  Social History Narrative   Not on file   Social Determinants of Health   Financial Resource Strain: Not on file  Food Insecurity: Food Insecurity Present (03/02/2023)   Hunger Vital Sign    Worried About Running Out of Food in the Last Year: Sometimes true    Ran Out of Food in the Last Year: Sometimes true  Transportation Needs: No Transportation Needs (03/02/2023)   PRAPARE - Administrator, Civil Service (Medical): No    Lack of Transportation (Non-Medical): No  Physical Activity: Not on file  Stress: Not on file  Social Connections: Not on file   Additional Social History:                         Sleep: Poor  Appetite:  Good  Current Medications: Current Facility-Administered Medications  Medication Dose Route Frequency Provider Last Rate Last Admin   acetaminophen (TYLENOL) tablet 650 mg  650 mg Oral Q6H PRN [name], [name], NP   650 mg at 03/02/23 2110   alum &  mag hydroxide-simeth (MAALOX/MYLANTA) 200-200-20 MG/5ML suspension 30 mL  30 mL Oral Q4H PRN [name], [name], NP       diphenhydrAMINE (BENADRYL) capsule 50 mg  50 mg Oral TID PRN [name], NP       Or   diphenhydrAMINE (BENADRYL) injection 50 mg  50 mg Intramuscular TID PRN [name], [name], NP       feeding supplement (ENSURE ENLIVE / ENSURE PLUS) liquid 237 mL  237 mL Oral BID BM [name], DO   237 mL at 03/06/23 0921   FLUoxetine (PROZAC) capsule 20 mg  20 mg Oral Daily [name], DO   20 mg at 03/06/23 0919   folic acid (FOLVITE) tablet 1 mg  1 mg Oral Daily [name], DO    1 mg at 03/06/23 1610   haloperidol (HALDOL) tablet 5 mg  5 mg Oral TID PRN [name], NP       Or   haloperidol lactate (HALDOL) injection 5 mg  5 mg Intramuscular TID PRN [name], NP       hydrOXYzine (ATARAX) tablet 50 mg  50 mg Oral TID PRN [name], NP   50 mg at 03/03/23 1009   LORazepam (ATIVAN) tablet 2 mg  2 mg Oral TID PRN [name], NP       Or   LORazepam (ATIVAN) injection 2 mg  2 mg Intramuscular TID PRN [name], [name], NP       magnesium hydroxide (MILK OF MAGNESIA) suspension 30 mL  30 mL Oral Daily PRN [name], [name], NP       multivitamin with minerals tablet 1 tablet  1 tablet Oral Daily [name], DO   1 tablet at 03/06/23 0919   risperiDONE (RISPERDAL) tablet 0.5 mg  0.5 mg Oral [id number] [name], DO   0.5 mg at 03/06/23 0919   thiamine (VITAMIN B1) tablet 100 mg  100 mg Oral Daily [name], DO   100 mg at 03/06/23 0919   traZODone (DESYREL) tablet 100 mg  100 mg Oral QHS PRN Lauree Chandler, NP        Lab Results: No results found for this or any previous visit (from the past 48 hour(s)).  Blood Alcohol level:  Lab Results  Component Value Date   ETH 219 (H) 03/02/2023   ETH 53 (H) 04/04/2021    Metabolic Disorder Labs: No results found for: "HGBA1C", "MPG" No results found for: "PROLACTIN" No results found for: "CHOL", "TRIG", "HDL", "CHOLHDL", "VLDL", "LDLCALC"  Physical Findings: AIMS:  , ,  ,  ,    CIWA:  CIWA-Ar Total: 0 COWS:     Musculoskeletal: Strength & Muscle Tone: within normal limits Gait & Station: normal Patient leans: N/A  Psychiatric Specialty Exam:  Presentation  General Appearance:  Appropriate  Eye Contact: Good  Speech: Clear and Coherent Normal rate and rhythm  Speech Volume: Normal  Handedness:No data recorded  Mood and Affect  Mood: Euthymic  Affect: Congruent, Full range  Thought Process  Thought Processes: Linear, Coherent  Descriptions of  Associations:Intact  Orientation:No data recorded Thought Content: Logical  History of Schizophrenia/Schizoaffective disorder:No data recorded Duration of Psychotic Symptoms:No data recorded Hallucinations:Denies Ideas of Reference:None  Suicidal Thoughts: Denies Homicidal Thoughts:Denies  Sensorium  Memory: Immediate Good; Recent Good; Remote Good  Judgment: Intact  Insight: Fair   Art therapist  Concentration: Good  Attention Span: Good  Recall: Good  Fund of Knowledge: Good  Language: Good  Psychomotor Activity  Psychomotor Activity: Normal  Assets  Assets: Communication Skills; Social Support; Housing   Sleep  Sleep:No data recorded   Blood pressure 124/78, pulse 84, temperature 98 F (36.7 C), temperature source Oral, resp. rate 20, height 5\' 3"  (1.6 m), weight 55.3 kg, SpO2 100 %. Body mass index is 21.61 kg/m.   Treatment Plan Summary: Daily contact with patient to assess and evaluate symptoms and progress in treatment, Medication management, and Plan   Increase trazodone from 50mg  oral at bedtime prn sleep to 100mg  oral at bedtime prn sleep.  Lauree Chandler, NP 03/06/2023, 1:32 PM

## 2023-03-06 NOTE — Group Note (Unsigned)
Date:  03/06/2023 Time:  9:07 PM  Group Topic/Focus:  Wrap-Up Group:   The focus of this group is to help patients review their daily goal of treatment and discuss progress on daily workbooks.     Participation Level:  {[facility] PARTICIPATION ZOXWR:60454}  Participation Quality:  {[facility] PARTICIPATION QUALITY:22265}  Affect:  {[facility] AFFECT:22266}  Cognitive:  {[facility] COGNITIVE:22267}  Insight: {B[facility] Insight2:20797}  Engagement in Group:  {[facility] ENGAGEMENT IN UJWJX:91478}  Modes of Intervention:  {[facility] MODES OF INTERVENTION:22269}  Additional Comments:  ***  Belva Crome 03/06/2023, 9:07 PM

## 2023-03-06 NOTE — Plan of Care (Signed)
D- Patient alert and oriented. Patient presented in a pleasant mood on assessment stating that she had some bad dreams last night and that it was hard for her to fall asleep, "it's like I laid there for hours". Patient had no complaints to voice to this Clinical research associate. Patient denied SI, HI, AVH, and pain at this time. Patient also denied any signs/symptoms of depression and anxiety, stating "no, it's good". Patient had no stated goals for today.  A- Scheduled medications administered to patient, per MD orders. Support and encouragement provided.  Routine safety checks conducted every 15 minutes.  Patient informed to notify staff with problems or concerns.  R- No adverse drug reactions noted. Patient contracts for safety at this time. Patient compliant with medications. Patient receptive, calm, and cooperative. Patient interacts well with others on the unit when she is out of her room and on the phone. Otherwise, she has been in bed, except for meals and medication. Patient remains safe at this time.  Problem: Education: Goal: Knowledge of General Education information will improve Description: Including pain rating scale, medication(s)/side effects and non-pharmacologic comfort measures Outcome: Progressing   Problem: Health Behavior/Discharge Planning: Goal: Ability to manage health-related needs will improve Outcome: Progressing   Problem: Clinical Measurements: Goal: Ability to maintain clinical measurements within normal limits will improve Outcome: Progressing Goal: Will remain free from infection Outcome: Progressing Goal: Diagnostic test results will improve Outcome: Progressing Goal: Respiratory complications will improve Outcome: Progressing Goal: Cardiovascular complication will be avoided Outcome: Progressing   Problem: Activity: Goal: Risk for activity intolerance will decrease Outcome: Progressing   Problem: Nutrition: Goal: Adequate nutrition will be maintained Outcome:  Progressing   Problem: Coping: Goal: Level of anxiety will decrease Outcome: Progressing   Problem: Elimination: Goal: Will not experience complications related to bowel motility Outcome: Progressing Goal: Will not experience complications related to urinary retention Outcome: Progressing   Problem: Pain Managment: Goal: General experience of comfort will improve Outcome: Progressing   Problem: Safety: Goal: Ability to remain free from injury will improve Outcome: Progressing   Problem: Skin Integrity: Goal: Risk for impaired skin integrity will decrease Outcome: Progressing   Problem: Education: Goal: Knowledge of disease or condition will improve Outcome: Progressing Goal: Understanding of discharge needs will improve Outcome: Progressing   Problem: Health Behavior/Discharge Planning: Goal: Ability to identify changes in lifestyle to reduce recurrence of condition will improve Outcome: Progressing Goal: Identification of resources available to assist in meeting health care needs will improve Outcome: Progressing   Problem: Physical Regulation: Goal: Complications related to the disease process, condition or treatment will be avoided or minimized Outcome: Progressing   Problem: Safety: Goal: Ability to remain free from injury will improve Outcome: Progressing   Problem: Education: Goal: Ability to make informed decisions regarding treatment will improve Outcome: Progressing   Problem: Coping: Goal: Coping ability will improve Outcome: Progressing   Problem: Health Behavior/Discharge Planning: Goal: Identification of resources available to assist in meeting health care needs will improve Outcome: Progressing   Problem: Medication: Goal: Compliance with prescribed medication regimen will improve Outcome: Progressing   Problem: Self-Concept: Goal: Ability to disclose and discuss suicidal ideas will improve Outcome: Progressing Goal: Will verbalize positive  feelings about self Outcome: Progressing

## 2023-03-06 NOTE — Plan of Care (Signed)
  Problem: Education: Goal: Knowledge of General Education information will improve Description: Including pain rating scale, medication(s)/side effects and non-pharmacologic comfort measures Outcome: Progressing   Problem: Coping: Goal: Level of anxiety will decrease Outcome: Progressing   Problem: Safety: Goal: Ability to remain free from injury will improve Outcome: Progressing   Problem: Safety: Goal: Ability to remain free from injury will improve Outcome: Progressing

## 2023-03-06 NOTE — Group Note (Signed)
Date:  03/06/2023 Time:  3:14 PM  Group Topic/Focus:  Activity Group:  The focus of this group was to encourage patients to come outside to the courtyard and get some exercise and fresh air to help assist with their physical and mental wellbeing.    Participation Level:  Did Not Attend   Mary Sella Eashan Schipani 03/06/2023, 3:14 PM

## 2023-03-07 MED ORDER — FLUOXETINE HCL 20 MG PO CAPS: 20.0000 mg | ORAL_CAPSULE | Freq: Every day | ORAL | 3 refills | Status: AC

## 2023-03-07 MED ORDER — RISPERIDONE 0.5 MG PO TABS: 0.5000 mg | ORAL_TABLET | ORAL | 3 refills | Status: AC

## 2023-03-07 MED ORDER — TRAZODONE HCL 100 MG PO TABS: 100.0000 mg | ORAL_TABLET | Freq: Every day | ORAL | 3 refills | Status: AC

## 2023-03-07 NOTE — Progress Notes (Signed)
  [facility] Adult Case Management Discharge Plan :  Will you be returning to the same living situation after discharge:  Yes,  pt plans to return home upon discharge. At discharge, do you have transportation home?: Yes,  support system to provide transportation home. Do you have the ability to pay for your medications: No.  Release of information consent forms completed and in the chart;  Patient's signature needed at discharge.  Patient to Follow up at:  Follow-up Information     [facility] Follow up.   Why: This facility provides services on a walk-in basis for new clients. Open access is Monday-Friday, 8am-3pm. It is recommended on the day that you go for walk-in services to arrive early to guarantee you are seen as clients are seen on a first come, first served basis. Thanks! Contact information: [address]  [place], [place] [address] Phone: [phone]                Next level of care provider has access to [facility] Link:no  Safety Planning and Suicide Prevention discussed: Yes,  SPE completed with mother, Kingston Mccrite.     Has patient been referred to the Quitline?: Patient refused referral for treatment  Patient has been referred for addiction treatment: Yes, referral information given but appointment not made [facility] [place]. (list facility).  Glenis Smoker, LCSW 03/07/2023, 4:02 PM

## 2023-03-07 NOTE — [facility] Suicide Risk Assessment (Signed)
[facility] Discharge Suicide Risk Assessment   Principal Problem: Major depressive disorder, single episode, severe without psychotic features (HCC) Discharge Diagnoses: Principal Problem:   Major depressive disorder, single episode, severe without psychotic features (HCC) Active Problems:   Suicidal ideation   Total Time spent with patient:45 minutes  Musculoskeletal: Strength & Muscle Tone: within normal limits Gait & Station: normal Patient leans: N/A  Psychiatric Specialty Exam  Presentation  General Appearance:  Disheveled  Eye Contact: Minimal  Speech: Clear and Coherent  Speech Volume: Decreased  Handedness:No data recorded  Mood and Affect  Mood: Dysphoric  Duration of Depression Symptoms: No data recorded Affect: Congruent   Thought Process  Thought Processes: Coherent  Descriptions of Associations:Intact  Orientation:No data recorded Thought Content:Other (comment) (Hallucinations)  History of Schizophrenia/Schizoaffective disorder:No data recorded Duration of Psychotic Symptoms:No data recorded Hallucinations:No data recorded Ideas of Reference:None  Suicidal Thoughts:No data recorded Homicidal Thoughts:No data recorded  Sensorium  Memory: Immediate Good; Recent Good; Remote Good  Judgment: Poor  Insight: Fair   Art therapist  Concentration: Fair  Attention Span: Fair  Recall: Good  Fund of Knowledge: Good  Language: Good   Psychomotor Activity  Psychomotor Activity:No data recorded  Assets  Assets: Communication Skills; Social Support; Housing   Sleep  Sleep:No data recorded   Blood pressure 129/77, pulse 72, temperature 97.8 F (36.6 C), temperature source Oral, resp. rate 20, height 5\' 3"  (1.6 m), weight 55.3 kg, SpO2 100 %. Body mass index is 21.61 kg/m.  Mental Status Per Nursing Assessment::   On Admission:  NA  Demographic Factors:  NA  Loss Factors: NA  Historical Factors: NA  Risk  Reduction Factors:   NA  Continued Clinical Symptoms:  Alcohol/Substance Abuse/Dependencies  Cognitive Features That Contribute To Risk:  None    Suicide Risk:  Minimal: No identifiable suicidal ideation.  Patients presenting with no risk factors but with morbid ruminations; may be classified as minimal risk based on the severity of the depressive symptoms   Follow-up Information     [facility] Follow up.   Why: This facility provides services on a walk-in basis for new clients. Open access is Monday-Friday, 8am-3pm. It is recommended on the day that you go for walk-in services to arrive early to guarantee you are seen as clients are seen on a first come, first served basis. Thanks! Contact information: [address]  [place], [place] [address] Phone: [phone]                Plan Of Care/Follow-up recommendations: [facility]   [name], DO 03/07/2023, 2:36 PM

## 2023-03-07 NOTE — Group Note (Signed)
Recreation Therapy Group Note   Group Topic:Coping Skills  Group Date: 03/07/2023 Start Time: 1000 End Time: 1050 Facilitators: Rosina Lowenstein, LRT, CTRS Location: Courtyard  Group Description: Mind Map.  Patient was provided a blank template of a diagram with 32 blank boxes in a tiered system, branching from the center (similar to a bubble chart). LRT directed patients to label the middle of the diagram "Coping Skills". LRT and patients then came up with 8 different coping skills as examples. Pt were directed to record their coping skills in the 2nd tier boxes closest to the center.  Patients would then share their coping skills with the group as LRT wrote them out. LRT gave a handout of 100 different coping skills at the end of group.   Goal Area(s) Addressed: Patients will be able to define "coping skills". Patient will identify new coping skills.  Patient will identify new possible leisure interests.    Affect/Mood: N/A   Participation Level: Did not attend    Clinical Observations/Individualized Feedback: Patient did not attend group.  Plan: Continue to engage patient in RT group sessions 2-3x/week.   Rosina Lowenstein, LRT, CTRS 03/07/2023 11:13 AM

## 2023-03-07 NOTE — Plan of Care (Signed)
  Problem: Education: Goal: Knowledge of General Education information will improve Description: Including pain rating scale, medication(s)/side effects and non-pharmacologic comfort measures Outcome: Adequate for Discharge   Problem: Health Behavior/Discharge Planning: Goal: Ability to manage health-related needs will improve Outcome: Adequate for Discharge   Problem: Clinical Measurements: Goal: Ability to maintain clinical measurements within normal limits will improve Outcome: Adequate for Discharge Goal: Will remain free from infection Outcome: Adequate for Discharge Goal: Diagnostic test results will improve Outcome: Adequate for Discharge Goal: Respiratory complications will improve Outcome: Adequate for Discharge Goal: Cardiovascular complication will be avoided Outcome: Adequate for Discharge   Problem: Activity: Goal: Risk for activity intolerance will decrease Outcome: Adequate for Discharge   Problem: Nutrition: Goal: Adequate nutrition will be maintained Outcome: Adequate for Discharge   Problem: Coping: Goal: Level of anxiety will decrease Outcome: Adequate for Discharge   Problem: Elimination: Goal: Will not experience complications related to bowel motility Outcome: Adequate for Discharge Goal: Will not experience complications related to urinary retention Outcome: Adequate for Discharge   Problem: Pain Managment: Goal: General experience of comfort will improve Outcome: Adequate for Discharge   Problem: Safety: Goal: Ability to remain free from injury will improve Outcome: Adequate for Discharge   Problem: Skin Integrity: Goal: Risk for impaired skin integrity will decrease Outcome: Adequate for Discharge   Problem: Education: Goal: Knowledge of disease or condition will improve Outcome: Adequate for Discharge Goal: Understanding of discharge needs will improve Outcome: Adequate for Discharge   Problem: Health Behavior/Discharge  Planning: Goal: Ability to identify changes in lifestyle to reduce recurrence of condition will improve Outcome: Adequate for Discharge Goal: Identification of resources available to assist in meeting health care needs will improve Outcome: Adequate for Discharge   Problem: Physical Regulation: Goal: Complications related to the disease process, condition or treatment will be avoided or minimized Outcome: Adequate for Discharge   Problem: Safety: Goal: Ability to remain free from injury will improve Outcome: Adequate for Discharge   Problem: Education: Goal: Ability to make informed decisions regarding treatment will improve Outcome: Adequate for Discharge   Problem: Coping: Goal: Coping ability will improve Outcome: Adequate for Discharge   Problem: Health Behavior/Discharge Planning: Goal: Identification of resources available to assist in meeting health care needs will improve Outcome: Adequate for Discharge   Problem: Medication: Goal: Compliance with prescribed medication regimen will improve Outcome: Adequate for Discharge   Problem: Self-Concept: Goal: Ability to disclose and discuss suicidal ideas will improve Outcome: Adequate for Discharge Goal: Will verbalize positive feelings about self Outcome: Adequate for Discharge

## 2023-03-07 NOTE — Group Note (Signed)
Date:  03/07/2023 Time:  2:00 PM  Group Topic/Focus:  Healthy Communication:   The focus of this group is to discuss communication, barriers to communication, as well as healthy ways to communicate with others.  Community Group   Participation Level:  Did Not Attend  Isabella Clements A Ananias Kolander 03/07/2023, 5:17 PM

## 2023-03-07 NOTE — Group Note (Signed)
Date:  03/07/2023 Time:  11:04 AM  Group Topic/Focus:  Activity Group:  The purpose of this activity group is to encourage the patients to come outside to the courtyard and get some fresh air and some exercise to contribute to their physical and mental wellbeing.    Participation Level:  Active  Participation Quality:  Appropriate  Affect:  Appropriate  Cognitive:  Alert  Insight: Appropriate  Engagement in Group:  Engaged  Modes of Intervention:  Activity  Additional Comments:    Isabella Clements 03/07/2023, 11:04 AM

## 2023-03-07 NOTE — Progress Notes (Signed)
   03/07/23 1202  CIWA-Ar  BP 129/77  Pulse Rate 72  Nausea and Vomiting 0  Tactile Disturbances 0  Tremor 0  Auditory Disturbances 0  Paroxysmal Sweats 0  Visual Disturbances 0  Anxiety 0  Headache, Fullness in Head 0  Agitation 0  Orientation and Clouding of Sensorium 0  CIWA-Ar Total 0

## 2023-03-07 NOTE — Discharge Summary (Signed)
Physician Discharge Summary Note  Patient:  Isabella Clements is an 37 y.o., female MRN:  [id number] DOB:  03-Jan-1986 Patient phone:  [phone] (home)  Patient address:   [address] [place] [address],  Total Time spent with patient: 1 hour  Date of Admission:  03/02/2023 Date of Discharge: 03/07/2023  Reason for Admission:  Taygen C Turneris a 37 y.o. female patient admitted under IVC for suicidal ideation. Per her record, patient was found in the road, holding a knife. Patient states "I want to hurt the people in my head".   Isabella Clements is a 37 y.o. female patient admitted under IVC for suicidal ideation. Per her record, patient was found in the road, holding a knife. She denies a prior hx of psychiatric diagnoses, but her medical record indicates MDD, alcohol use disorder, and cocaine abuse. She reports one prior psychiatric hospitalization and suicide attempt, stating she cut her wrist a "couple of years ago". Patient showed heal scar on L wrist.  Medical record indicates a prior psychiatric hospitalization at [facility] from 03/25/21 until 04/08/21, where she presented with SI and picking up a knife, similar to today's presentation. Patient reports drinking a pint of Aristocrat yesterday and smoking crack cocaine. She reports starting to drink alcohol at 37 yo and started smoking crack for the past 3 years. She reports auditory hallucinations that repeat her name, call her ugly, "Sarena you stink", "I love you", and voices tell her to eat. She reports visual hallucinations, stating I see things in the sky but is unable to elaborate further. Patient states "I want to hurt the voices in my head". She reports that AVH started after starting crack cocaine, "everybody blames it on crack". Patient does not believe her hallucinations are induced by crack use. Patient requesting inpatient treatment, stating she wants help. BAC 219, UDS positive for cocaine and cannabinoid. She  denies being established with an outpatient psychiatric provider or taking psychiatric medications to manage symptoms.   Principal Problem: Major depressive disorder, single episode, severe without psychotic features [facility]) Discharge Diagnoses: Principal Problem:   Major depressive disorder, single episode, severe without psychotic features (HCC) Active Problems:   Suicidal ideation   Past Psychiatric History: Substance abuse and depression.  Past Medical History: History reviewed. No pertinent past medical history. History reviewed. No pertinent surgical history. Family History: History reviewed. No pertinent family history. Family Psychiatric  History: Unremarkable Social History:  Social History   Substance and Sexual Activity  Alcohol Use Yes   Alcohol/week: 7.0 standard drinks of alcohol   Types: 7 Cans of beer per week   Comment: pt states, "a beer a day"     Social History   Substance and Sexual Activity  Drug Use Yes   Types: Cocaine, "Crack" cocaine    Social History   Socioeconomic History   Marital status: Single    Spouse name: Not on file   Number of children: Not on file   Years of education: Not on file   Highest education level: Not on file  Occupational History   Not on file  Tobacco Use   Smoking status: Every Day    Packs/day: .5    Types: Cigarettes   Smokeless tobacco: Never  Vaping Use   Vaping Use: Not on file  Substance and Sexual Activity   Alcohol use: Yes    Alcohol/week: 7.0 standard drinks of alcohol    Types: 7 Cans of beer per week    Comment:  pt states, "a beer a day"   Drug use: Yes    Types: Cocaine, "Crack" cocaine   Sexual activity: Yes    Birth control/protection: None  Other Topics Concern   Not on file  Social History Narrative   Not on file   Social Determinants of Health   Financial Resource Strain: Not on file  Food Insecurity: Food Insecurity Present (03/02/2023)   Hunger Vital Sign    Worried About Running Out  of Food in the Last Year: Sometimes true    Ran Out of Food in the Last Year: Sometimes true  Transportation Needs: No Transportation Needs (03/02/2023)   PRAPARE - Administrator, Civil Service (Medical): No    Lack of Transportation (Non-Medical): No  Physical Activity: Not on file  Stress: Not on file  Social Connections: Not on file    Hospital Course: Dois Clements was involuntarily admitted to inpatient psychiatry after alcohol and crack cocaine use.  She was successfully detoxed on a Valium taper.  Initially, she wanted to go to rehab but stated that she was going to go to [facility] outpatient.  She was placed on Prozac, Risperdal, and trazodone and did well with the medications.  Her vital signs remained stable.  It was felt that she maximized hospitalization she was discharged home.  On the day of discharge she denied suicidal ideation, homicidal ideation, auditory or visual hallucinations.  Her judgment and insight are good.  Physical Findings: AIMS:  , ,  ,  ,    CIWA:  CIWA-Ar Total: 0 COWS:     Musculoskeletal: Strength & Muscle Tone: within normal limits Gait & Station: normal Patient leans: N/A   Psychiatric Specialty Exam:  Presentation  General Appearance:  Disheveled  Eye Contact: Minimal  Speech: Clear and Coherent  Speech Volume: Decreased  Handedness:No data recorded  Mood and Affect  Mood: Dysphoric  Affect: Congruent   Thought Process  Thought Processes: Coherent  Descriptions of Associations:Intact  Orientation:No data recorded Thought Content:Other (comment) (Hallucinations)  History of Schizophrenia/Schizoaffective disorder:No data recorded Duration of Psychotic Symptoms:No data recorded Hallucinations:No data recorded Ideas of Reference:None  Suicidal Thoughts:No data recorded Homicidal Thoughts:No data recorded  Sensorium  Memory: Immediate Good; Recent Good; Remote Good  Judgment: Poor  Insight: Fair   Restaurant manager, fast food  Concentration: Fair  Attention Span: Fair  Recall: Good  Fund of Knowledge: Good  Language: Good   Psychomotor Activity  Psychomotor Activity:No data recorded  Assets  Assets: Communication Skills; Social Support; Housing   Sleep  Sleep:No data recorded   Physical Exam: Physical Exam Vitals and nursing note reviewed.  Constitutional:      Appearance: Normal appearance. She is normal weight.  Neurological:     General: No focal deficit present.     Mental Status: She is alert and oriented to person, place, and time.  Psychiatric:        Attention and Perception: Attention and perception normal.        Mood and Affect: Mood and affect normal.        Speech: Speech normal.        Behavior: Behavior normal. Behavior is cooperative.        Thought Content: Thought content normal.        Cognition and Memory: Cognition and memory normal.        Judgment: Judgment normal.    Review of Systems  Constitutional: Negative.   HENT: Negative.    Eyes: Negative.  Respiratory: Negative.    Cardiovascular: Negative.   Gastrointestinal: Negative.   Genitourinary: Negative.   Musculoskeletal: Negative.   Skin: Negative.   Neurological: Negative.   Endo/Heme/Allergies: Negative.   Psychiatric/Behavioral: Negative.     Blood pressure 129/77, pulse 72, temperature 97.8 F (36.6 C), temperature source Oral, resp. rate 20, height 5\' 3"  (1.6 m), weight 55.3 kg, SpO2 100 %. Body mass index is 21.61 kg/m.   Social History   Tobacco Use  Smoking Status Every Day   Packs/day: .5   Types: Cigarettes  Smokeless Tobacco Never   Tobacco Cessation:  A prescription for an FDA-approved tobacco cessation medication was offered at discharge and the patient refused   Blood Alcohol level:  Lab Results  Component Value Date   ETH 219 (H) 03/02/2023   ETH 53 (H) 04/04/2021    Metabolic Disorder Labs:  No results found for: "HGBA1C", "MPG" No results found for:  "PROLACTIN" No results found for: "CHOL", "TRIG", "HDL", "CHOLHDL", "VLDL", "LDLCALC"  See Psychiatric Specialty Exam and Suicide Risk Assessment completed by Attending Physician prior to discharge.  Discharge destination:  Home  Is patient on multiple antipsychotic therapies at discharge:  No   Has Patient had three or more failed trials of antipsychotic monotherapy by history:  No  Recommended Plan for Multiple Antipsychotic Therapies: NA   Allergies as of 03/07/2023   No Known Allergies      Medication List     STOP taking these medications    escitalopram 10 MG tablet Commonly known as: LEXAPRO   folic acid 1 MG tablet Commonly known as: FOLVITE   hydrOXYzine 25 MG tablet Commonly known as: ATARAX   thiamine 100 MG tablet Commonly known as: VITAMIN B1       TAKE these medications      Indication  FLUoxetine 20 MG capsule Commonly known as: PROZAC Take 1 capsule (20 mg total) by mouth daily. Start taking on: March 08, 2023  Indication: Abuse or Misuse of Alcohol, Depression   lisinopril 10 MG tablet Commonly known as: ZESTRIL Take 1 tablet (10 mg total) by mouth daily. What changed: Another medication with the same name was removed. Continue taking this medication, and follow the directions you see here.  Indication: High Blood Pressure Disorder   risperiDONE 0.5 MG tablet Commonly known as: RISPERDAL Take 1 tablet (0.5 mg total) by mouth 2 (two) times daily at 8 am and 4 pm.  Indication: Major Depressive Disorder   traZODone 100 MG tablet Commonly known as: DESYREL Take 1 tablet (100 mg total) by mouth at bedtime.  Indication: Abuse or Misuse of Alcohol, Trouble Sleeping, Major Depressive Disorder        Follow-up Information     [facility] Follow up.   Why: This facility provides services on a walk-in basis for new clients. Open access is Monday-Friday, 8am-3pm. It is recommended on the day that you go for  walk-in services to arrive early to guarantee you are seen as clients are seen on a first come, first served basis. Thanks! Contact information: [address]  [place], [place] [address] Phone: [phone]                Follow-up recommendations:  [facility]    Signed: [name], DO 03/07/2023, 2:41 PM

## 2023-03-07 NOTE — Progress Notes (Signed)
D- Patient alert and oriented. Patient presented in a pleasant mood on assessment stating that she slept "pretty good" last night and had no complaints to voice to this Clinical research associate. Patient denied SI, HI, AVH, and pain at this time. Patient also denied any signs/symptoms of depression and anxiety, stating that overall she is feeling "pretty good". Patient's goal for today is "going home".  A- Scheduled medications administered to patient, per MD orders. Support and encouragement provided. Routine safety checks conducted every 15 minutes. Patient informed to notify staff with problems or concerns.  R- No adverse drug reactions noted. Patient contracts for safety at this time. Patient compliant with medications and treatment plan. Patient receptive, calm, and cooperative. Patient interacts well with others on the unit. Patient remains safe at this time.

## 2023-03-07 NOTE — Progress Notes (Signed)
Patient ID: Isabella Clements, female   DOB: 04/01/1986, 37 y.o.   MRN: [id number]  Discharge Note:  Patient denies SI/HI/AVH at this time. Discharge instructions, AVS, prescriptions, and transition record gone over with patient. Patient given a copy of her Suicide Safety Plan. Patient agrees to comply with medication management, follow-up visit, and outpatient therapy. Patient belongings returned to patient. Patient questions and concerns addressed and answered. Patient ambulatory off unit. Patient discharged to home with a friend.

## 2023-04-23 ENCOUNTER — Other Ambulatory Visit: Payer: Self-pay

## 2023-04-23 ENCOUNTER — Emergency Department
Admission: EM | Admit: 2023-04-23 | Discharge: 2023-04-23 | Disposition: A | Discharge status: Home / Self Care | Payer: Self-pay | Attending: Emergency Medicine | Admitting: Emergency Medicine

## 2023-04-23 DIAGNOSIS — D72829 Elevated white blood cell count, unspecified: Secondary | ICD-10-CM | POA: Insufficient documentation

## 2023-04-23 DIAGNOSIS — N730 Acute parametritis and pelvic cellulitis: Secondary | ICD-10-CM | POA: Insufficient documentation

## 2023-04-23 DIAGNOSIS — Z8659 Personal history of other mental and behavioral disorders: Secondary | ICD-10-CM | POA: Insufficient documentation

## 2023-04-23 DIAGNOSIS — A549 Gonococcal infection, unspecified: Secondary | ICD-10-CM | POA: Insufficient documentation

## 2023-04-23 LAB — CBC
HCT: 40.5 % (ref 36.0–46.0)
Hemoglobin: 14.7 g/dL (ref 12.0–15.0)
MCH: 30.8 pg (ref 26.0–34.0)
MCHC: 36.3 g/dL — ABNORMAL HIGH (ref 30.0–36.0)
MCV: 84.7 fL (ref 80.0–100.0)
Platelets: 320 10*3/uL (ref 150–400)
RBC: 4.78 MIL/uL (ref 3.87–5.11)
RDW: 13.7 % (ref 11.5–15.5)
WBC: 13 10*3/uL — ABNORMAL HIGH (ref 4.0–10.5)
nRBC: 0 % (ref 0.0–0.2)

## 2023-04-23 LAB — COMPREHENSIVE METABOLIC PANEL WITH GFR
ALT: 15 U/L (ref 0–44)
AST: 17 U/L (ref 15–41)
Albumin: 3.7 g/dL (ref 3.5–5.0)
Alkaline Phosphatase: 50 U/L (ref 38–126)
Anion gap: 7 (ref 5–15)
BUN: 19 mg/dL (ref 6–20)
CO2: 21 mmol/L — ABNORMAL LOW (ref 22–32)
Calcium: 8.3 mg/dL — ABNORMAL LOW (ref 8.9–10.3)
Chloride: 109 mmol/L (ref 98–111)
Creatinine, Ser: 0.98 mg/dL (ref 0.44–1.00)
GFR, Estimated: >60 mL/min (ref >60)
Glucose, Bld: 100 mg/dL — ABNORMAL HIGH (ref 70–99)
Potassium: 3.7 mmol/L (ref 3.5–5.1)
Sodium: 137 mmol/L (ref 135–145)
Total Bilirubin: 0.9 mg/dL (ref 0.3–1.2)
Total Protein: 6.8 g/dL (ref 6.5–8.1)

## 2023-04-23 LAB — URINALYSIS, ROUTINE W REFLEX MICROSCOPIC
Bilirubin Urine: NEGATIVE
Glucose, UA: NEGATIVE mg/dL
Ketones, ur: NEGATIVE mg/dL
Nitrite: NEGATIVE
Protein, ur: 30 mg/dL — AB
Specific Gravity, Urine: 1.027 (ref 1.005–1.030)
WBC, UA: >50 WBC/hpf (ref 0–5)
pH: 5 (ref 5.0–8.0)

## 2023-04-23 LAB — CHLAMYDIA/NGC RT PCR (ARMC ONLY)
Chlamydia Tr: NOT DETECTED
N gonorrhoeae: DETECTED — AB

## 2023-04-23 LAB — WET PREP, GENITAL
Sperm: NONE SEEN
Trich, Wet Prep: NONE SEEN
WBC, Wet Prep HPF POC: <10 (ref <10)
Yeast Wet Prep HPF POC: NONE SEEN

## 2023-04-23 LAB — LIPASE, BLOOD: Lipase: 48 U/L (ref 11–51)

## 2023-04-23 LAB — PREGNANCY, URINE: Preg Test, Ur: NEGATIVE

## 2023-04-23 LAB — RAPID HIV SCREEN (HIV 1/2 AB+AG)
HIV 1/2 Antibodies: NONREACTIVE
HIV-1 P24 Antigen - HIV24: NONREACTIVE

## 2023-04-23 MED ORDER — IBUPROFEN 600 MG PO TABS
600.0000 mg | ORAL_TABLET | Freq: Once | ORAL | Status: AC
  Administered 2023-04-23: 600 mg via ORAL
  Filled 2023-04-23: qty 1

## 2023-04-23 MED ORDER — DOXYCYCLINE HYCLATE 100 MG PO CAPS: 100.0000 mg | ORAL_CAPSULE | Freq: Two times a day (BID) | ORAL | 0 refills | Status: DC

## 2023-04-23 MED ORDER — ONDANSETRON 4 MG PO TBDP: 4.0000 mg | ORAL_TABLET | Freq: Four times a day (QID) | ORAL | 0 refills | Status: AC | PRN

## 2023-04-23 MED ORDER — ONDANSETRON 4 MG PO TBDP
4.0000 mg | ORAL_TABLET | Freq: Once | ORAL | Status: AC
  Administered 2023-04-23: 4 mg via ORAL
  Filled 2023-04-23: qty 1

## 2023-04-23 MED ORDER — CEFTRIAXONE SODIUM 1 G IJ SOLR
500.0000 mg | Freq: Once | INTRAMUSCULAR | Status: AC
  Administered 2023-04-23: 500 mg via INTRAMUSCULAR
  Filled 2023-04-23: qty 10

## 2023-04-23 MED ORDER — METRONIDAZOLE 500 MG PO TABS: 500.0000 mg | ORAL_TABLET | Freq: Two times a day (BID) | ORAL | 0 refills | Status: AC

## 2023-04-23 NOTE — ED Triage Notes (Signed)
Abdominal pain x 1 week.  Has had some nausea and vomiting.  AAOx3.  Skin warm and dry. NAD

## 2023-04-23 NOTE — ED Provider Notes (Signed)
[facility] Provider Note    Event Date/Time   First MD Initiated Contact with Patient 04/23/23 [id number]     (approximate)   History   Abdominal Pain   HPI  Isabella Clements is a 37 y.o. female history of alcohol use disorder, major depression, cocaine abuse, previous psychiatric hospitalization discharged on July 2 of this year  Patient resents for evaluation reports concern for possible STD exposure.  She started to notice for about 1 week as slight nausea and an achiness over her lower pelvic area.  She is not mostly vaginal discharge or bleeding and does not believe she is pregnant.  However she reports her boyfriend has also come to the ER today, and he has been noticing a penile discharge.  No fevers or chills.  No chest pain.  Mild nausea still eating and drinking.  No severe pain just an achiness over the lower pelvic region.      Physical Exam   Triage Vital Signs: ED Triage Vitals  Encounter Vitals Group     BP 04/23/23 0819 (!) 151/94     Systolic BP Percentile --      Diastolic BP Percentile --      Pulse Rate 04/23/23 0819 (!) 57     Resp 04/23/23 0819 16     Temp 04/23/23 0819 98 F (36.7 C)     Temp src --      SpO2 04/23/23 0819 98 %     Weight 04/23/23 0808 121 lb 14.6 oz (55.3 kg)     Height 04/23/23 0808 5\' 3"  (1.6 m)     Head Circumference --      Peak Flow --      Pain Score 04/23/23 0807 8     Pain Loc --      Pain Education --      Exclude from Growth Chart --     Most recent vital signs: Vitals:   04/23/23 0819 04/23/23 1231  BP: (!) 151/94 (!) 148/90  Pulse: (!) 57 60  Resp: 16 16  Temp: 98 F (36.7 C) 98.1 F (36.7 C)  SpO2: 98% 99%     General: Awake, no distress.  CV:  Good peripheral perfusion.  Resp:  Normal effort.  Abd:  No distention.  Abdomen is soft nondistended nontender throughout.  Not obviously gravid.  Negative Murphy.  Patient reports a slight achy discomfort to palpation in the suprapubic  region only.  No focal pain McBurney's point no rebound or guarding no evidence of peritonitis. Other:  External exam normal.  Internal exam shows small amount of whitish discharge.  Digital exam minimal discomfort but no severe discomfort no masses or focal adnexal tenderness.  Escorted by nurse Herbert Seta   ED Results / Procedures / Treatments   Labs (all labs ordered are listed, but only abnormal results are displayed) Labs Reviewed  CHLAMYDIA/NGC RT PCR ([facility] ONLY)           - Abnormal; Notable for the following components:      Result Value   N gonorrhoeae DETECTED (*)    All other components within normal limits  WET PREP, GENITAL - Abnormal; Notable for the following components:   Clue Cells Wet Prep HPF POC PRESENT (*)    All other components within normal limits  COMPREHENSIVE METABOLIC PANEL - Abnormal; Notable for the following components:   CO2 21 (*)    Glucose, Bld 100 (*)    Calcium 8.3 (*)  All other components within normal limits  CBC - Abnormal; Notable for the following components:   WBC 13.0 (*)    MCHC 36.3 (*)    All other components within normal limits  URINALYSIS, ROUTINE W REFLEX MICROSCOPIC - Abnormal; Notable for the following components:   Color, Urine YELLOW (*)    APPearance CLOUDY (*)    Hgb urine dipstick SMALL (*)    Protein, ur 30 (*)    Leukocytes,Ua LARGE (*)    Bacteria, UA RARE (*)    All other components within normal limits  URINE CULTURE  LIPASE, BLOOD  PREGNANCY, URINE  RAPID HIV SCREEN (HIV 1/2 AB+AG)   Labs notable for mild leukocytosis white count 13,000.  Urinalysis interpreted as equivocal, fairly dirty sample obtained.  Patient does not have any dysuria malodorous urine or urgency.  Will send for culture.  EKG     RADIOLOGY     PROCEDURES:  Critical Care performed: No  Procedures   MEDICATIONS ORDERED IN ED: Medications  ibuprofen (ADVIL) tablet 600 mg (600 mg Oral Given 04/23/23 0924)  ondansetron  (ZOFRAN-ODT) disintegrating tablet 4 mg (4 mg Oral Given 04/23/23 0924)  cefTRIAXone (ROCEPHIN) injection 500 mg (500 mg Intramuscular Given 04/23/23 1230)     IMPRESSION / MDM / ASSESSMENT AND PLAN / ED COURSE  I reviewed the triage vital signs and the nursing notes.                              Differential diagnosis includes, but is not limited to, possible gynecologic infection or STDs exposure especially given both the patient's lower pelvic symptoms and her reports that her boyfriend has been experiencing penile discharge.  She does not have any findings of peritonitis.  Her pelvic exam reassuring with mild cervical motion discomfort but no severe pain no adnexal pain or fluctuance.  Suspect low likelihood of acute pelvic abscess or acute complicating feature of PID.  Patient's presentation is most consistent with acute complicated illness / injury requiring diagnostic workup.   White count 13,000.  Testing positive for gonorrhea.  Reviewed current guidelines, will treat with IM Rocephin and 2 weeks of doxycycline plus Flagyl.  Advised patient on common side effects and to avoid alcohol use while taking these medications which she understands and agreeable with.  She will abstain from intercourse.  She advises her partner is already aware of her diagnosis as he was also here getting evaluated for same  Return precautions and treatment recommendations and follow-up discussed with the patient who is agreeable with the plan.        FINAL CLINICAL IMPRESSION(S) / ED DIAGNOSES   Final diagnoses:  PID (acute pelvic inflammatory disease)  Gonorrhea   Rx / DC Orders   ED Discharge Orders          Ordered    metroNIDAZOLE (FLAGYL) 500 MG tablet  2 times daily        04/23/23 1223    doxycycline (VIBRAMYCIN) 100 MG capsule  2 times daily        04/23/23 1223    ondansetron (ZOFRAN-ODT) 4 MG disintegrating tablet  Every 6 hours PRN        04/23/23 1223             Note:   This document was prepared using Dragon voice recognition software and may include unintentional dictation errors.   Sharyn Creamer, MD 04/23/23 [id number]

## 2023-04-23 NOTE — Discharge Instructions (Addendum)
You were seen in the emergency room for abdominal pain. It is important that you follow up closely with your primary care doctor in the next couple of days. ° °If you're unable to see her primary care doctor you may return to the emergency room or go to the [facility] walk-in clinic in 1 or 2 days for reexam. ° °Please return to the emergency room right away if you are to develop a fever, severe nausea, your pain becomes severe or worsens, you are unable to keep food down, begin vomiting any dark or bloody fluid, you develop any dark or bloody stools, feel dehydrated, or other new concerns or symptoms arise. ° °

## 2023-04-24 ENCOUNTER — Telehealth: Payer: Self-pay | Admitting: Emergency Medicine

## 2023-04-24 LAB — URINE CULTURE: Culture: <10000 — AB

## 2023-04-24 MED ORDER — DOXYCYCLINE MONOHYDRATE 100 MG PO TABS: 100.0000 mg | ORAL_TABLET | Freq: Two times a day (BID) | ORAL | 0 refills | Status: AC

## 2023-04-24 NOTE — Telephone Encounter (Signed)
Pt req change in formulation for doxy due to cost.

## 2023-11-04 ENCOUNTER — Emergency Department
Admission: EM | Admit: 2023-11-04 | Discharge: 2023-11-04 | Disposition: A | Discharge status: Home / Self Care | Payer: Self-pay | Attending: Emergency Medicine | Admitting: Emergency Medicine

## 2023-11-04 ENCOUNTER — Emergency Department: Payer: Self-pay

## 2023-11-04 ENCOUNTER — Other Ambulatory Visit: Payer: Self-pay

## 2023-11-04 DIAGNOSIS — S61412A Laceration without foreign body of left hand, initial encounter: Secondary | ICD-10-CM | POA: Insufficient documentation

## 2023-11-04 DIAGNOSIS — X58XXXA Exposure to other specified factors, initial encounter: Secondary | ICD-10-CM | POA: Insufficient documentation

## 2023-11-04 MED ORDER — LIDOCAINE HCL (PF) 1 % IJ SOLN
10.0000 mL | Freq: Once | INTRAMUSCULAR | Status: DC
  Filled 2023-11-04: qty 10

## 2023-11-04 MED ORDER — TETANUS-DIPHTH-ACELL PERTUSSIS 5-2.5-18.5 LF-MCG/0.5 IM SUSY
0.5000 mL | PREFILLED_SYRINGE | Freq: Once | INTRAMUSCULAR | Status: DC
  Filled 2023-11-04: qty 0.5

## 2023-11-04 NOTE — ED Notes (Addendum)
 Pt was brought to bed 19H by tech.  Pt was talking loudly stating "aint no one looked at my hand. I aint even had an X-Ray"  MD immediately went to bedside and examined pt.  MD explained to pt that he would clean and suture pt wound.  MD requested suture cart to bedside and ordered lidocaine and Tdap.  Pt began yelling after MD left bedside and this RN approached pt and explained that she cannot yell as there are people in the hallways sleeping and she is disturbing them.  Pt acknowledged this and lowered her voice.  RN acquired suture cart, lidocaine, and Tdap.    While RN was bringing medication to bedside pt began yelling again.  Pt stood from bed and began walking down hallway.  Pt using profanities against staff and against the female that drove her to the hospital.  RN walked behind pt while she exited the ED, pt walked into the lobby and continued to yell.  Security in lobby escorted pt outside.  Pt refused discharge papers and discharge instructions.
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
## 2023-11-04 NOTE — Discharge Instructions (Addendum)
Please keep your wound clean by washing at least daily with soap and water. If you see any signs of infection like spreading redness, pus coming from the wound, extreme pain, fevers, chills or any other worsening doctor right away or come back to the emergency department  

## 2023-11-05 ENCOUNTER — Other Ambulatory Visit: Payer: Self-pay

## 2023-11-05 ENCOUNTER — Encounter (HOSPITAL_COMMUNITY): Payer: Self-pay

## 2023-11-05 ENCOUNTER — Emergency Department (HOSPITAL_COMMUNITY)
Admission: EM | Admit: 2023-11-05 | Discharge: 2023-11-05 | Disposition: A | Discharge status: Home / Self Care | Payer: Self-pay | Attending: Emergency Medicine | Admitting: Emergency Medicine

## 2023-11-05 DIAGNOSIS — T07XXXA Unspecified multiple injuries, initial encounter: Secondary | ICD-10-CM

## 2023-11-05 DIAGNOSIS — X58XXXA Exposure to other specified factors, initial encounter: Secondary | ICD-10-CM | POA: Insufficient documentation

## 2023-11-05 DIAGNOSIS — S61411A Laceration without foreign body of right hand, initial encounter: Secondary | ICD-10-CM | POA: Insufficient documentation

## 2023-11-05 DIAGNOSIS — Z23 Encounter for immunization: Secondary | ICD-10-CM | POA: Insufficient documentation

## 2023-11-05 DIAGNOSIS — S61531A Puncture wound without foreign body of right wrist, initial encounter: Secondary | ICD-10-CM | POA: Insufficient documentation

## 2023-11-05 DIAGNOSIS — S51811A Laceration without foreign body of right forearm, initial encounter: Secondary | ICD-10-CM | POA: Insufficient documentation

## 2023-11-05 MED ORDER — BACITRACIN ZINC 500 UNIT/GM EX OINT
TOPICAL_OINTMENT | Freq: Once | CUTANEOUS | Status: AC
  Filled 2023-11-05: qty 2.7

## 2023-11-05 MED ORDER — TETANUS-DIPHTH-ACELL PERTUSSIS 5-2.5-18.5 LF-MCG/0.5 IM SUSY
0.5000 mL | PREFILLED_SYRINGE | Freq: Once | INTRAMUSCULAR | Status: AC
  Administered 2023-11-05: 0.5 mL via INTRAMUSCULAR
  Filled 2023-11-05: qty 0.5

## 2023-11-05 NOTE — ED Provider Notes (Signed)
 [place] EMERGENCY DEPARTMENT AT [facility] Provider Note   CSN: [id number] Arrival date & time: 11/05/23  0216     History  Chief Complaint  Patient presents with   Hand Injury    Isabella Clements is a 38 y.o. [name]  38 yo F who sustained lacerations Friday evening sometime. Went to [facility], had XR and prior to suturing patient apparently became verbally irate and left against medical advice. Xr there viewed and interpreted by myself without obvious fracture or foreign body. Tonight she decided she wanted sutures. Appears that she did not receive updated tetanus yesterday although It was ordered.    Hand Injury      Home Medications Prior to Admission medications   Medication Sig Start Date End Date Taking? Authorizing Provider  FLUoxetine (PROZAC) 20 MG capsule Take 1 capsule (20 mg total) by mouth daily. 03/08/23   Isabella Ill, DO  lisinopril (ZESTRIL) 10 MG tablet Take 1 tablet (10 mg total) by mouth daily. 04/09/21   Isabella Mulders, NP  metroNIDAZOLE (FLAGYL) 500 MG tablet Take 1 tablet (500 mg total) by mouth 2 (two) times daily. 04/23/23   Isabella Creamer, MD  ondansetron (ZOFRAN-ODT) 4 MG disintegrating tablet Take 1 tablet (4 mg total) by mouth every 6 (six) hours as needed for nausea or vomiting. 04/23/23   Isabella Creamer, MD  risperiDONE (RISPERDAL) 0.5 MG tablet Take 1 tablet (0.5 mg total) by mouth 2 (two) times daily at 8 am and 4 pm. 03/07/23   Isabella Ill, DO  traZODone (DESYREL) 100 MG tablet Take 1 tablet (100 mg total) by mouth at bedtime. 03/07/23   Isabella Ill, DO      Allergies    Patient has no known allergies.    Review of Systems   Review of Systems  Physical Exam Updated Vital Signs BP (!) 162/115 (BP Location: Right Arm)   Pulse 83   Temp 98.2 F (36.8 C) (Oral)   Resp 18   Ht 5\' 3"  (1.6 m)   Wt 65.8 kg   LMP 11/03/2023   SpO2 99%   BMI 25.69 kg/m  Physical Exam Vitals and nursing note reviewed.   Constitutional:      Appearance: She is well-developed.  HENT:     Head: Normocephalic and atraumatic.  Cardiovascular:     Rate and Rhythm: Normal rate and regular rhythm.  Pulmonary:     Effort: No respiratory distress.     Breath sounds: No stridor.  Abdominal:     General: There is no distension.  Musculoskeletal:     Cervical back: Normal range of motion.     Comments: Laceration to adipose tissue dorsal aspect mid forearm, puncture wound approximately 6 cm proximal to right wrist, superficial laceration to webspace between thumb and hand. NVI distally.   Neurological:     Mental Status: She is alert.     ED Results / Procedures / Treatments   Labs (all labs ordered are listed, but only abnormal results are displayed) Labs Reviewed - No data to display  EKG None  Radiology DG Hand 2 View Right Result Date: 11/04/2023 CLINICAL DATA:  hand laceration EXAM: RIGHT HAND - 2 VIEW COMPARISON:  X-ray right hand 04/05/2016 FINDINGS: There is no evidence of fracture or dislocation. There is no evidence of arthropathy or other focal bone abnormality. Soft tissues are unremarkable. No retained radiopaque foreign body. IMPRESSION: Negative. Electronically Signed   By: Isabella Clements.D.  On: 11/04/2023 01:52    Procedures Procedures    Medications Ordered in ED Medications  bacitracin ointment (has no administration in time range)  Tdap (BOOSTRIX) injection 0.5 mL (has no administration in time range)    ED Course/ Medical Decision Making/ A&P                                 Medical Decision Making Risk OTC drugs. Prescription drug management.   Not sure what time this happened (first vitals on 3/1 were 0111 so presumably at least ten minutes prior to that) but is well over 24 hours and thus is not really appropriate for primary closure. Will allow to heal by secondary intention. Discussed wound care and possible plastic surgery follow up in the future for revision if  she preferred. No obvious vascular, tendinous or neurologic injury requiring urgent hand follow up but referral will be placed if those things seem to be present in the future.   Final Clinical Impression(s) / ED Diagnoses Final diagnoses:  None    Rx / DC Orders ED Discharge Orders     None         Kamari Buch, Barbara Cower, MD 11/05/23 0301
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
## 2024-06-28 ENCOUNTER — Emergency Department
Admission: EM | Admit: 2024-06-28 | Discharge: 2024-06-28 | Disposition: A | Discharge status: Home / Self Care | Payer: Self-pay

## 2024-06-28 ENCOUNTER — Other Ambulatory Visit: Payer: Self-pay

## 2024-06-28 DIAGNOSIS — M545 Low back pain, unspecified: Secondary | ICD-10-CM | POA: Insufficient documentation

## 2024-06-28 MED ORDER — LIDOCAINE 5 % EX PTCH
1.0000 | MEDICATED_PATCH | CUTANEOUS | Status: DC
  Administered 2024-06-28: 1 via TRANSDERMAL
  Filled 2024-06-28: qty 1

## 2024-06-28 MED ORDER — PREDNISONE 20 MG PO TABS
40.0000 mg | ORAL_TABLET | Freq: Once | ORAL | Status: AC
  Administered 2024-06-28: 40 mg via ORAL
  Filled 2024-06-28: qty 2

## 2024-06-28 MED ORDER — LIDOCAINE 5 % EX PTCH: 1.0000 | MEDICATED_PATCH | CUTANEOUS | 0 refills | Status: AC

## 2024-06-28 MED ORDER — PREDNISONE 20 MG PO TABS: 20.0000 mg | ORAL_TABLET | Freq: Two times a day (BID) | ORAL | 0 refills | Status: AC

## 2024-06-28 MED ORDER — METHOCARBAMOL 500 MG PO TABS
750.0000 mg | ORAL_TABLET | Freq: Once | ORAL | Status: AC
  Administered 2024-06-28: 750 mg via ORAL
  Filled 2024-06-28: qty 2

## 2024-06-28 MED ORDER — METHOCARBAMOL 500 MG PO TABS: 500.0000 mg | ORAL_TABLET | Freq: Three times a day (TID) | ORAL | 0 refills | Status: AC | PRN

## 2024-06-28 MED ORDER — DIAZEPAM 5 MG PO TABS
5.0000 mg | ORAL_TABLET | Freq: Once | ORAL | Status: AC
  Administered 2024-06-28: 5 mg via ORAL
  Filled 2024-06-28: qty 1

## 2024-06-28 NOTE — Discharge Instructions (Signed)

## 2024-06-28 NOTE — ED Triage Notes (Signed)
 C/o lower back pain that started 4 days ago. Pt denies trauma, falls. GCS 15

## 2024-06-28 NOTE — ED Provider Notes (Signed)
 [facility] Provider Note    Event Date/Time   First MD Initiated Contact with Patient 06/28/24 1634     (approximate)   History   Back Pain   HPI  Isabella Clements is a 38 y.o. female with a past medical history of alcohol use disorder, cocaine use, major depression who presents to the emergency department with 3 days of progressively worsening lumbar back pain.  Patient reports that she did some stretching before bed the night before her symptoms started and may have pulled something.  She states that she woke up with pain midline in her lumbar area which has progressively worsened.  Denies any extremity weakness or saddle anesthesia.  She has no urinary incontinence or urinary retention.  Denies any prior history of spinal epidural abscess, intravenous drug use, recent sepsis or infection.  She denies the possibility of pregnancy.  She presents with her husband who contributes to the history.  Denies any SI HI or AVH S      Physical Exam   Triage Vital Signs: ED Triage Vitals  Encounter Vitals Group     BP 06/28/24 1626 (!) 152/94     Girls Systolic BP Percentile --      Girls Diastolic BP Percentile --      Boys Systolic BP Percentile --      Boys Diastolic BP Percentile --      Pulse Rate 06/28/24 1626 85     Resp 06/28/24 1626 18     Temp 06/28/24 1626 98.1 F (36.7 C)     Temp Source 06/28/24 1626 Oral     SpO2 06/28/24 1626 94 %     Weight --      Height 06/28/24 1625 5' 2 (1.575 m)     Head Circumference --      Peak Flow --      Pain Score 06/28/24 1624 8     Pain Loc --      Pain Education --      Exclude from Growth Chart --     Most recent vital signs: Vitals:   06/28/24 1626  BP: (!) 152/94  Pulse: 85  Resp: 18  Temp: 98.1 F (36.7 C)  SpO2: 94%    Nursing Triage Note reviewed. Vital signs reviewed and patients oxygen saturation is normoxic  General: Patient is well nourished, well developed, awake and alert,  resting comfortably in no acute distress, eating a bag of chips and soda in the room Head: Normocephalic and atraumatic Eyes: Normal inspection, extraocular muscles intact, no conjunctival pallor Ear, nose, throat: Normal external exam Neck: Normal range of motion Respiratory: Patient is in no respiratory distress, lungs CTAB Cardiovascular: Patient is not tachycardic, RRR without murmur appreciated GI: Abd SNT with no guarding or rebound  Back: Normal inspection of the back with good strength and range of motion throughout all ext Patient points to pain over her lumbar spine however pain is not replicated by palpation.  Able to straight leg lift bilaterally however this causes some pain.  There were no overlying skin changes. Extremities: pulses intact with good cap refills, no LE pitting edema or calf tenderness Neuro: The patient is alert and oriented to person, place, and time, appropriately conversive, with 5/5 bilat UE/LE strength, no gross motor or sensory defects noted. Coordination appears to be adequate.  Ambulates without abnormality Skin: Warm, dry, and intact Psych: normal mood and affect, no SI or HI  ED Results / Procedures /  Treatments   Labs (all labs ordered are listed, but only abnormal results are displayed) Labs Reviewed - No data to display   EKG None  RADIOLOGY None    PROCEDURES:  Critical Care performed: No  Procedures   MEDICATIONS ORDERED IN ED: Medications  lidocaine  (LIDODERM ) 5 % 1 patch (1 patch Transdermal Patch Applied 06/28/24 1700)  predniSONE (DELTASONE) tablet 40 mg (40 mg Oral Given 06/28/24 1657)  diazepam  (VALIUM ) tablet 5 mg (5 mg Oral Given 06/28/24 1658)  methocarbamol (ROBAXIN) tablet 750 mg (750 mg Oral Given 06/28/24 1657)     IMPRESSION / MDM / ASSESSMENT AND PLAN / ED COURSE                                Differential diagnosis includes, but is not limited to, muscle spasm, muscle strain, herniated disc,  sciatica   ED course: Patient is well-appearing and without any focal neurological deficits.  She has no risk factors for epidural hematoma or spinal epidural abscess.  Presentation is not consistent with any trauma or fracture and I do not think imaging would be of use today.  Symptoms improved after a small dose of Valium , methocarbamol, prednisone and a Lidoderm  patch.  Patient and husband were counseled on return precautions including cauda equina and voiced understanding.  Has been will drive the patient home.  I will send her for short course of steroids and methocarbamol.  She will follow-up with primary care physician.  All questions answered and patient voiced understanding and requested discharge.  At time of discharge there is no evidence of acute life, limb, vision, or fertility threat. Patient has stable vital signs, pain is well controlled, patient is ambulatory and p.o. tolerant.  Discharge instructions were completed using the EPIC system. I would refer you to those at this time. All warnings prescriptions follow-up etc. were discussed in detail with the patient. Patient indicates understanding and is agreeable with this plan. All questions answered.  Patient is made aware that they may return to the emergency department for any worsening or new condition or for any other emergency.     --  COPA: 5 The patient has the following acute or chronic illness/injury that poses a possible threat to life or bodily function: [X] : The patient has a potentially serious acute condition or an acute exacerbation of a chronic illness requiring urgent evaluation and management in the Emergency Department. The clinical presentation necessitates immediate consideration of life-threatening or function-threatening diagnoses, even if they are ultimately ruled out.   FINAL CLINICAL IMPRESSION(S) / ED DIAGNOSES   Final diagnoses:  Acute midline low back pain without sciatica     Rx / DC Orders    ED Discharge Orders          Ordered    methocarbamol (ROBAXIN) 500 MG tablet  Every 8 hours PRN        06/28/24 1645    lidocaine  (LIDODERM ) 5 %  Every 24 hours        06/28/24 1645    predniSONE (DELTASONE) 20 MG tablet  2 times daily with meals        06/28/24 1645    Ambulatory Referral to Primary Care (Establish Care)        06/28/24 1645             Note:  This document was prepared using Dragon voice recognition software and may include unintentional dictation errors.  Nicholaus Rolland BRAVO, MD 06/28/24 [phone]
